# Patient Record
Sex: Female | Born: 1980 | Hispanic: Yes | Marital: Married | Smoking: Never smoker
Health system: Southern US, Community
[De-identification: ages and names within clinical notes are randomized; demographics above are authoritative.]

## PROBLEM LIST (undated history)

## (undated) DIAGNOSIS — R519 Headache, unspecified: Secondary | ICD-10-CM

## (undated) DIAGNOSIS — R51 Headache: Secondary | ICD-10-CM

## (undated) DIAGNOSIS — O139 Gestational [pregnancy-induced] hypertension without significant proteinuria, unspecified trimester: Secondary | ICD-10-CM

## (undated) HISTORY — PX: NO PAST SURGERIES: SHX2092

## (undated) HISTORY — PX: MOUTH SURGERY: SHX715

## (undated) HISTORY — DX: Headache, unspecified: R51.9

## (undated) HISTORY — DX: Headache: R51

---

## 2004-09-27 ENCOUNTER — Ambulatory Visit (HOSPITAL_COMMUNITY): Admission: RE | Admit: 2004-09-27 | Discharge: 2004-09-27 | Payer: Self-pay | Admitting: *Deleted

## 2004-12-14 ENCOUNTER — Ambulatory Visit: Payer: Self-pay | Admitting: *Deleted

## 2004-12-14 ENCOUNTER — Inpatient Hospital Stay (HOSPITAL_COMMUNITY): Admission: AD | Admit: 2004-12-14 | Discharge: 2004-12-16 | Payer: Self-pay | Admitting: Family Medicine

## 2004-12-20 ENCOUNTER — Ambulatory Visit: Payer: Self-pay | Admitting: *Deleted

## 2004-12-27 ENCOUNTER — Ambulatory Visit: Payer: Self-pay | Admitting: Family Medicine

## 2005-01-10 ENCOUNTER — Ambulatory Visit (HOSPITAL_COMMUNITY): Admission: RE | Admit: 2005-01-10 | Discharge: 2005-01-10 | Payer: Self-pay | Admitting: Family Medicine

## 2005-01-10 ENCOUNTER — Ambulatory Visit: Payer: Self-pay | Admitting: Family Medicine

## 2005-01-17 ENCOUNTER — Ambulatory Visit: Payer: Self-pay | Admitting: Family Medicine

## 2005-01-24 ENCOUNTER — Ambulatory Visit: Payer: Self-pay | Admitting: *Deleted

## 2005-01-28 ENCOUNTER — Ambulatory Visit: Payer: Self-pay | Admitting: Obstetrics & Gynecology

## 2005-01-31 ENCOUNTER — Ambulatory Visit: Payer: Self-pay | Admitting: Family Medicine

## 2005-02-04 ENCOUNTER — Ambulatory Visit: Payer: Self-pay | Admitting: *Deleted

## 2005-02-07 ENCOUNTER — Inpatient Hospital Stay (HOSPITAL_COMMUNITY): Admission: AD | Admit: 2005-02-07 | Discharge: 2005-02-10 | Payer: Self-pay | Admitting: Family Medicine

## 2005-02-07 ENCOUNTER — Ambulatory Visit: Payer: Self-pay | Admitting: Family Medicine

## 2012-03-27 ENCOUNTER — Ambulatory Visit: Payer: Self-pay

## 2012-04-01 NOTE — L&D Delivery Note (Signed)
Delivery Note At 11:15 AM a viable female was delivered via Vaginal, Spontaneous Delivery (Presentation: Left Occiput Posterior).  APGAR: 9, 9; weight .   Placenta status: Intact, Spontaneous.  Cord: 3 vessels with the following complications: None.  Cord pH: not done  Anesthesia: Epidural  Episiotomy:  Lacerations:  Suture Repair: 2.0 vicryl Est. Blood Loss (mL):   Mom to postpartum.  Baby to nursery-stable.  MARSHALL,BERNARD A 11/12/2012, 11:24 AM

## 2012-05-07 LAB — OB RESULTS CONSOLE ABO/RH: RH Type: POSITIVE

## 2012-05-07 LAB — OB RESULTS CONSOLE GC/CHLAMYDIA
Chlamydia: NEGATIVE
Gonorrhea: NEGATIVE

## 2012-05-07 LAB — OB RESULTS CONSOLE HEPATITIS B SURFACE ANTIGEN: Hepatitis B Surface Ag: NEGATIVE

## 2012-05-07 LAB — OB RESULTS CONSOLE RPR: RPR: NONREACTIVE

## 2012-05-07 LAB — OB RESULTS CONSOLE HIV ANTIBODY (ROUTINE TESTING): HIV: NONREACTIVE

## 2012-05-07 LAB — OB RESULTS CONSOLE RUBELLA ANTIBODY, IGM: Rubella: IMMUNE

## 2012-10-03 LAB — OB RESULTS CONSOLE GBS: GBS: NEGATIVE

## 2012-11-11 ENCOUNTER — Inpatient Hospital Stay (HOSPITAL_COMMUNITY)
Admission: AD | Admit: 2012-11-11 | Discharge: 2012-11-14 | DRG: 775 | Disposition: A | Discharge status: Home / Self Care | Payer: Medicaid Other | Source: Ambulatory Visit | Attending: Obstetrics | Admitting: Obstetrics

## 2012-11-11 ENCOUNTER — Encounter (HOSPITAL_COMMUNITY): Payer: Self-pay | Admitting: Anesthesiology

## 2012-11-11 ENCOUNTER — Inpatient Hospital Stay (HOSPITAL_COMMUNITY): Payer: Medicaid Other | Admitting: Anesthesiology

## 2012-11-11 ENCOUNTER — Encounter (HOSPITAL_COMMUNITY): Payer: Self-pay | Admitting: *Deleted

## 2012-11-11 HISTORY — DX: Gestational (pregnancy-induced) hypertension without significant proteinuria, unspecified trimester: O13.9

## 2012-11-11 LAB — CBC
MCH: 30.9 pg (ref 26.0–34.0)
MCHC: 34.5 g/dL (ref 30.0–36.0)
MCV: 89.5 fL (ref 78.0–100.0)
Platelets: 236 10*3/uL (ref 150–400)
RDW: 12.9 % (ref 11.5–15.5)

## 2012-11-11 MED ORDER — OXYTOCIN BOLUS FROM INFUSION: 500.0000 mL | INTRAVENOUS | Status: DC

## 2012-11-11 MED ORDER — PHENYLEPHRINE 40 MCG/ML (10ML) SYRINGE FOR IV PUSH (FOR BLOOD PRESSURE SUPPORT): 80.0000 ug | PREFILLED_SYRINGE | INTRAVENOUS | Status: DC | PRN

## 2012-11-11 MED ORDER — LACTATED RINGERS IV SOLN: 500.0000 mL | INTRAVENOUS | Status: DC | PRN

## 2012-11-11 MED ORDER — IBUPROFEN 600 MG PO TABS
600.0000 mg | ORAL_TABLET | Freq: Four times a day (QID) | ORAL | Status: DC | PRN
  Administered 2012-11-12: 600 mg via ORAL
  Filled 2012-11-11 (×2): qty 1

## 2012-11-11 MED ORDER — LACTATED RINGERS IV SOLN
500.0000 mL | Freq: Once | INTRAVENOUS | Status: AC
  Administered 2012-11-11: 500 mL via INTRAVENOUS

## 2012-11-11 MED ORDER — PHENYLEPHRINE 40 MCG/ML (10ML) SYRINGE FOR IV PUSH (FOR BLOOD PRESSURE SUPPORT)
80.0000 ug | PREFILLED_SYRINGE | INTRAVENOUS | Status: DC | PRN
  Filled 2012-11-11: qty 5

## 2012-11-11 MED ORDER — EPHEDRINE 5 MG/ML INJ
10.0000 mg | INTRAVENOUS | Status: DC | PRN
  Filled 2012-11-11: qty 4

## 2012-11-11 MED ORDER — OXYTOCIN 40 UNITS IN LACTATED RINGERS INFUSION - SIMPLE MED
62.5000 mL/h | INTRAVENOUS | Status: DC
  Filled 2012-11-11: qty 1000

## 2012-11-11 MED ORDER — ACETAMINOPHEN 325 MG PO TABS: 650.0000 mg | ORAL_TABLET | ORAL | Status: DC | PRN

## 2012-11-11 MED ORDER — ONDANSETRON HCL 4 MG/2ML IJ SOLN: 4.0000 mg | Freq: Four times a day (QID) | INTRAMUSCULAR | Status: DC | PRN

## 2012-11-11 MED ORDER — LIDOCAINE HCL (PF) 1 % IJ SOLN
30.0000 mL | INTRAMUSCULAR | Status: DC | PRN
  Filled 2012-11-11: qty 30

## 2012-11-11 MED ORDER — LIDOCAINE HCL (PF) 1 % IJ SOLN
INTRAMUSCULAR | Status: DC | PRN
  Administered 2012-11-11: 3 mL
  Administered 2012-11-11: 4 mL

## 2012-11-11 MED ORDER — CITRIC ACID-SODIUM CITRATE 334-500 MG/5ML PO SOLN: 30.0000 mL | ORAL | Status: DC | PRN

## 2012-11-11 MED ORDER — DIPHENHYDRAMINE HCL 50 MG/ML IJ SOLN
12.5000 mg | INTRAMUSCULAR | Status: DC | PRN
  Administered 2012-11-12: 12.5 mg via INTRAVENOUS
  Filled 2012-11-11: qty 1

## 2012-11-11 MED ORDER — FLEET ENEMA 7-19 GM/118ML RE ENEM: 1.0000 | ENEMA | Freq: Once | RECTAL | Status: DC

## 2012-11-11 MED ORDER — LACTATED RINGERS IV SOLN
INTRAVENOUS | Status: DC
  Administered 2012-11-11 – 2012-11-12 (×2): via INTRAVENOUS

## 2012-11-11 MED ORDER — FENTANYL 2.5 MCG/ML BUPIVACAINE 1/10 % EPIDURAL INFUSION (WH - ANES)
INTRAMUSCULAR | Status: DC | PRN
  Administered 2012-11-11: 12 mL/h via EPIDURAL

## 2012-11-11 MED ORDER — FENTANYL 2.5 MCG/ML BUPIVACAINE 1/10 % EPIDURAL INFUSION (WH - ANES)
14.0000 mL/h | INTRAMUSCULAR | Status: DC | PRN
  Administered 2012-11-12: 14 mL/h via EPIDURAL
  Filled 2012-11-11 (×2): qty 125

## 2012-11-11 MED ORDER — BUTORPHANOL TARTRATE 1 MG/ML IJ SOLN: 1.0000 mg | INTRAMUSCULAR | Status: DC | PRN

## 2012-11-11 MED ORDER — OXYCODONE-ACETAMINOPHEN 5-325 MG PO TABS
1.0000 | ORAL_TABLET | ORAL | Status: DC | PRN
  Administered 2012-11-12: 1 via ORAL
  Filled 2012-11-11: qty 1

## 2012-11-11 MED ORDER — EPHEDRINE 5 MG/ML INJ: 10.0000 mg | INTRAVENOUS | Status: DC | PRN

## 2012-11-11 NOTE — Anesthesia Procedure Notes (Signed)
Epidural Patient location during procedure: OB Start time: 11/11/2012 10:27 PM  Staffing Anesthesiologist: Pharell Rolfson A. Performed by: anesthesiologist   Preanesthetic Checklist Completed: patient identified, site marked, surgical consent, pre-op evaluation, timeout performed, IV checked, risks and benefits discussed and monitors and equipment checked  Epidural Patient position: sitting Prep: site prepped and draped and DuraPrep Patient monitoring: continuous pulse ox and blood pressure Approach: midline Injection technique: LOR air  Needle:  Needle type: Tuohy  Needle gauge: 17 G Needle length: 9 cm and 9 Needle insertion depth: 7 cm Catheter type: closed end flexible Catheter size: 19 Gauge Catheter at skin depth: 12 cm Test dose: negative and Other  Assessment Events: blood not aspirated, injection not painful, no injection resistance, negative IV test and no paresthesia  Additional Notes Patient identified. Risks and benefits discussed including failed block, incomplete  Pain control, post dural puncture headache, nerve damage, paralysis, blood pressure Changes, nausea, vomiting, reactions to medications-both toxic and allergic and post Partum back pain. All questions were answered. Patient expressed understanding and wished to proceed. Sterile technique was used throughout procedure. Epidural site was Dressed with sterile barrier dressing. No paresthesias, signs of intravascular injection Or signs of intrathecal spread were encountered.  Patient was more comfortable after the epidural was dosed. Please see RN's note for documentation of vital signs and FHR which are stable.

## 2012-11-11 NOTE — Anesthesia Preprocedure Evaluation (Signed)
Anesthesia Evaluation  Patient identified by MRN, date of birth, ID band Patient awake    Reviewed: Allergy & Precautions, H&P , Patient's Chart, lab work & pertinent test results  Airway Mallampati: II TM Distance: >3 FB Neck ROM: full    Dental no notable dental hx. (+) Teeth Intact   Pulmonary neg pulmonary ROS,  breath sounds clear to auscultation  Pulmonary exam normal       Cardiovascular hypertension, Rhythm:regular Rate:Normal  PIH   Neuro/Psych negative neurological ROS  negative psych ROS   GI/Hepatic negative GI ROS, Neg liver ROS,   Endo/Other  negative endocrine ROS  Renal/GU negative Renal ROS  negative genitourinary   Musculoskeletal   Abdominal Normal abdominal exam  (+)   Peds  Hematology negative hematology ROS (+)   Anesthesia Other Findings   Reproductive/Obstetrics (+) Pregnancy                           Anesthesia Physical Anesthesia Plan  ASA: II  Anesthesia Plan: Epidural   Post-op Pain Management:    Induction:   Airway Management Planned:   Additional Equipment:   Intra-op Plan:   Post-operative Plan:   Informed Consent: I have reviewed the patients History and Physical, chart, labs and discussed the procedure including the risks, benefits and alternatives for the proposed anesthesia with the patient or authorized representative who has indicated his/her understanding and acceptance.     Plan Discussed with: Anesthesiologist  Anesthesia Plan Comments:         Anesthesia Quick Evaluation

## 2012-11-11 NOTE — MAU Note (Signed)
Pt states she started having lower abd pain today.  Pt states she is having some vaginal bleeding like first day of period.  No other vaginal discharge or ROM.  Good fetal movement.

## 2012-11-12 ENCOUNTER — Encounter (HOSPITAL_COMMUNITY): Payer: Self-pay | Admitting: *Deleted

## 2012-11-12 LAB — ABO/RH: ABO/RH(D): O POS

## 2012-11-12 LAB — TYPE AND SCREEN

## 2012-11-12 MED ORDER — ONDANSETRON HCL 4 MG PO TABS: 4.0000 mg | ORAL_TABLET | ORAL | Status: DC | PRN

## 2012-11-12 MED ORDER — DIPHENHYDRAMINE HCL 25 MG PO CAPS: 25.0000 mg | ORAL_CAPSULE | Freq: Four times a day (QID) | ORAL | Status: DC | PRN

## 2012-11-12 MED ORDER — TERBUTALINE SULFATE 1 MG/ML IJ SOLN: 0.2500 mg | Freq: Once | INTRAMUSCULAR | Status: DC | PRN

## 2012-11-12 MED ORDER — TETANUS-DIPHTH-ACELL PERTUSSIS 5-2.5-18.5 LF-MCG/0.5 IM SUSP
0.5000 mL | Freq: Once | INTRAMUSCULAR | Status: AC
  Administered 2012-11-14: 0.5 mL via INTRAMUSCULAR
  Filled 2012-11-12 (×2): qty 0.5

## 2012-11-12 MED ORDER — DIBUCAINE 1 % RE OINT
1.0000 "application " | TOPICAL_OINTMENT | RECTAL | Status: DC | PRN
  Filled 2012-11-12: qty 28

## 2012-11-12 MED ORDER — OXYTOCIN 40 UNITS IN LACTATED RINGERS INFUSION - SIMPLE MED
1.0000 m[IU]/min | INTRAVENOUS | Status: DC
  Administered 2012-11-12: 2 m[IU]/min via INTRAVENOUS

## 2012-11-12 MED ORDER — ZOLPIDEM TARTRATE 5 MG PO TABS: 5.0000 mg | ORAL_TABLET | Freq: Every evening | ORAL | Status: DC | PRN

## 2012-11-12 MED ORDER — LACTATED RINGERS IV SOLN: INTRAVENOUS | Status: DC

## 2012-11-12 MED ORDER — BENZOCAINE-MENTHOL 20-0.5 % EX AERO
1.0000 "application " | INHALATION_SPRAY | CUTANEOUS | Status: DC | PRN
  Filled 2012-11-12: qty 56

## 2012-11-12 MED ORDER — BUPIVACAINE HCL (PF) 0.25 % IJ SOLN
INTRAMUSCULAR | Status: DC | PRN
  Administered 2012-11-12: 5 mL
  Administered 2012-11-12: 2 mL
  Administered 2012-11-12: 3 mL

## 2012-11-12 MED ORDER — LANOLIN HYDROUS EX OINT: TOPICAL_OINTMENT | CUTANEOUS | Status: DC | PRN

## 2012-11-12 MED ORDER — WITCH HAZEL-GLYCERIN EX PADS: 1.0000 "application " | MEDICATED_PAD | CUTANEOUS | Status: DC | PRN

## 2012-11-12 MED ORDER — SENNOSIDES-DOCUSATE SODIUM 8.6-50 MG PO TABS
2.0000 | ORAL_TABLET | Freq: Every day | ORAL | Status: DC
  Administered 2012-11-12 – 2012-11-13 (×2): 2 via ORAL

## 2012-11-12 MED ORDER — SIMETHICONE 80 MG PO CHEW: 80.0000 mg | CHEWABLE_TABLET | ORAL | Status: DC | PRN

## 2012-11-12 MED ORDER — ONDANSETRON HCL 4 MG/2ML IJ SOLN: 4.0000 mg | INTRAMUSCULAR | Status: DC | PRN

## 2012-11-12 MED ORDER — FERROUS SULFATE 325 (65 FE) MG PO TABS
325.0000 mg | ORAL_TABLET | Freq: Two times a day (BID) | ORAL | Status: DC
  Administered 2012-11-12 – 2012-11-14 (×4): 325 mg via ORAL
  Filled 2012-11-12 (×6): qty 1

## 2012-11-12 MED ORDER — FAMOTIDINE IN NACL 20-0.9 MG/50ML-% IV SOLN
20.0000 mg | Freq: Once | INTRAVENOUS | Status: AC
Start: 2012-11-12 — End: 2012-11-12
  Administered 2012-11-12: 20 mg via INTRAVENOUS
  Filled 2012-11-12: qty 50

## 2012-11-12 MED ORDER — PRENATAL MULTIVITAMIN CH
1.0000 | ORAL_TABLET | Freq: Every day | ORAL | Status: DC
  Administered 2012-11-12 – 2012-11-14 (×3): 1 via ORAL
  Filled 2012-11-12 (×3): qty 1

## 2012-11-12 MED ORDER — IBUPROFEN 600 MG PO TABS
600.0000 mg | ORAL_TABLET | Freq: Four times a day (QID) | ORAL | Status: DC
  Administered 2012-11-12 – 2012-11-14 (×8): 600 mg via ORAL
  Filled 2012-11-12 (×7): qty 1

## 2012-11-12 MED ORDER — BUTALBITAL-APAP-CAFFEINE 50-325-40 MG PO TABS
1.0000 | ORAL_TABLET | ORAL | Status: DC | PRN
  Administered 2012-11-12: 1 via ORAL
  Filled 2012-11-12: qty 1

## 2012-11-12 MED ORDER — OXYCODONE-ACETAMINOPHEN 5-325 MG PO TABS
1.0000 | ORAL_TABLET | ORAL | Status: DC | PRN
  Administered 2012-11-12 – 2012-11-13 (×2): 1 via ORAL
  Administered 2012-11-13 (×2): 2 via ORAL
  Administered 2012-11-14: 1 via ORAL
  Filled 2012-11-12 (×3): qty 1
  Filled 2012-11-12 (×2): qty 2

## 2012-11-12 NOTE — Progress Notes (Signed)
Pt stated she was having chest pain, headache and fhr tracing variables. Dr. Gaynell Face and interpreter called for clarification. Pt said the headache and chest pains ended after a few minutes. Pulse ox placed and pt is stable at this time.

## 2012-11-12 NOTE — H&P (Signed)
This is Dr. Francoise Ceo dictating the history and physical on blank blank she's a 32 year old gravida 2 para 1001 at 90 weeks and 6 days due date 11/20/2012 negative GBS admitted in labor she is now 6 cm 80-90% vertex -2-3 amniotomy performed fluid clear IUPC inserted and she is on Pitocin Past medical history negative Past surgical history negative Social history negative System review negative Physical exam well-developed female in labor HEENT negative Lungs clear to P&A Breasts negative Heart regular rhythm no murmurs no gallops Abdomen term Pelvic as described above Extremities negative and

## 2012-11-12 NOTE — Progress Notes (Signed)
Dr. Gaynell Face notified of SVE, FHR, UC pattern, MVUs, tachsystole noted, and medication given.  Dr. Gaynell Face also notified that pt denies chest pain at this time.  Orders received to D/C pitocin, will continue to monitor.

## 2012-11-13 LAB — CBC
HCT: 26.6 % — ABNORMAL LOW (ref 36.0–46.0)
MCH: 30.9 pg (ref 26.0–34.0)
MCHC: 35 g/dL (ref 30.0–36.0)
MCV: 88.4 fL (ref 78.0–100.0)
Platelets: 106 10*3/uL — ABNORMAL LOW (ref 150–400)
RDW: 13.2 % (ref 11.5–15.5)
WBC: 12.5 10*3/uL — ABNORMAL HIGH (ref 4.0–10.5)

## 2012-11-13 NOTE — Progress Notes (Signed)
UR chart review completed.  

## 2012-11-13 NOTE — Progress Notes (Signed)
Patient ID: Caroline Garrison, female   DOB: Apr 14, 1980, 32 y.o.   MRN: [id number] Postpartum day one Vital signs normal Fundus firm Lochia moderate Legs negative Doing well

## 2012-11-13 NOTE — Anesthesia Postprocedure Evaluation (Signed)
  Anesthesia Post-op Note  Anesthesia Post Note  Patient: Caroline Garrison  Procedure(s) Performed: * No procedures listed *  Anesthesia type: Spinal  Patient location: Mother/Baby  Post pain: Pain level controlled  Post assessment: Post-op Vital signs reviewed  Last Vitals:  Filed Vitals:   11/13/12 0608  BP: 101/64  Pulse: 73  Temp: 36.5 C  Resp: 18    Post vital signs: Reviewed  Level of consciousness: awake  Complications: No apparent anesthesia complications

## 2012-11-14 NOTE — Discharge Summary (Signed)
Obstetric Discharge Summary Reason for Admission: onset of labor Prenatal Procedures: none Intrapartum Procedures: spontaneous vaginal delivery Postpartum Procedures: none Complications-Operative and Postpartum: none Hemoglobin  Date Value Range Status  11/13/2012 9.3* 12.0 - 15.0 g/dL Final     DELTA CHECK NOTED     REPEATED TO VERIFY     HCT  Date Value Range Status  11/13/2012 26.6* 36.0 - 46.0 % Final    Physical Exam:  General: alert Lochia: appropriate Uterine Fundus: firm Incision: healing well DVT Evaluation: No evidence of DVT seen on physical exam.  Discharge Diagnoses: Term Pregnancy-delivered  Discharge Information: Date: 11/14/2012 Activity: pelvic rest Diet: routine Medications: Percocet Condition: stable Instructions: refer to practice specific booklet Discharge to: home Follow-up Information   Follow up with Kathreen Cosier, MD.   Specialty:  Obstetrics and Gynecology   Contact information:   [address] [place] [address] [phone]       Newborn Data: Live born female  Birth Weight: 6 lb 14 oz (3119 g) APGAR: 9, 9  Home with mother.  MARSHALL,BERNARD A 11/14/2012, 6:38 AM

## 2014-01-31 ENCOUNTER — Encounter (HOSPITAL_COMMUNITY): Payer: Self-pay | Admitting: *Deleted

## 2016-03-21 DIAGNOSIS — Z8759 Personal history of other complications of pregnancy, childbirth and the puerperium: Secondary | ICD-10-CM | POA: Insufficient documentation

## 2016-04-05 ENCOUNTER — Ambulatory Visit (INDEPENDENT_AMBULATORY_CARE_PROVIDER_SITE_OTHER): Payer: Self-pay | Admitting: Neurology

## 2016-04-05 ENCOUNTER — Encounter: Payer: Self-pay | Admitting: Neurology

## 2016-04-05 VITALS — BP 134/83 | HR 80 | Wt 128.0 lb

## 2016-04-05 DIAGNOSIS — G44221 Chronic tension-type headache, intractable: Secondary | ICD-10-CM

## 2016-04-05 DIAGNOSIS — R51 Headache: Secondary | ICD-10-CM

## 2016-04-05 DIAGNOSIS — R519 Headache, unspecified: Secondary | ICD-10-CM

## 2016-04-05 DIAGNOSIS — G44229 Chronic tension-type headache, not intractable: Secondary | ICD-10-CM | POA: Insufficient documentation

## 2016-04-05 MED ORDER — AMITRIPTYLINE HCL 25 MG PO TABS: 25.0000 mg | ORAL_TABLET | Freq: Every day | ORAL | 6 refills | Status: DC

## 2016-04-05 NOTE — Progress Notes (Signed)
[facility]    Provider:  Dr Lucia GaskinsAhern Referring Provider: Sandre KittyLonergan, Malia A, PA-C Primary Care Physician:  Sandre KittyLonergan, Malia A, PA-C  CC:  headaches  HPI:  Caroline Garrison is a 36 y.o. female here as a referral from Dr. Missy SabinsLonergan for tension headaches. Past medical history of hypertension, insomnia, tension headaches and migraines for 3 years. She is non-English speaking and is here with an interpreter. Headaches are in the temple areas, pain doesn't go away, it "comes and goes", since December getting worse, doctor prescribed Propranolol and Trazodone but patient not taking these they did not help. She also feels tension in the neck and in the temples. She has insomnia, she can;t sleep and burning on the top of the head. Headache described as pounding. Also pain in the eyes. She has headaches every day. They can be 2-3/10 but at it worse it can be 8-9/10. It can last all day long, continuous sje goes to bed with it and wakes up with it. Sometimes it comes and goes throughout the day. No nausea or vomiting. When the headache is very strong the light and noise bothers her. She has burning in the head. She has not had images of the brain. Sister has migraines. She denies stress. She has a lot of tension in the neck. No other focal neurologic deficits, associated symptoms, modifiable factors or inciting events.  Reviewed notes, labs and imaging from outside physicians, which showed:   Review primary care notes. She complains of headache, and the temporal areas and at the top of the head, pressure pulsating running, burning and heavy pain. She suffered from both migraine headaches and tension headaches for years. She often times feels dizzy. No nausea vomiting, no speech difficulties, no syncope, no paresthesias, no nasal congestion or rhinorrhea, no knee felt pressure, no sore throat, no cough no shortness of breath. Headaches are often. Recent stress in her life making it worse. She  notices tension in the neck and upper shoulders as well. No inciting events or trauma. Exam showed spasms and tenderness on palpation cervical muscles. Otherwise musculoskeletal system in general as well as neurologic exam is normal. They do describe anxiety and her mental status report. She was started on diclofenac for tension type headaches twice a day, and Flexeril daily for cervical muscular pain. Neurologically exams normal, headache is likely tension headache per consult the patient thinks she may have migraine without aura. Dizziness also related to headaches. For her insomnia she was started on trazodone, for her tension type headaches she was given diclofenac, for her migraine without aura result triptan caused side effects and propranolol was started for preventative as well as sumatriptan.   CBC with differential 05/22/2015 was normal.   Review of Systems: Patient complains of symptoms per HPI as well as the following symptoms: Blurred vision, easy bruising, headache, insomnia, sleepiness. Pertinent negatives per HPI. All others negative.   Social History   Social History  . Marital status: Single    Spouse name: N/A  . Number of children: N/A  . Years of education: N/A   Occupational History  . not working    Social History Main Topics  . Smoking status: Never Smoker  . Smokeless tobacco: Not on file  . Alcohol use No  . Drug use: No  . Sexual activity: Yes    Birth control/ protection: None   Other Topics Concern  . Not on file   Social History Narrative   Lives   Caffeine use:  Family History  Problem Relation Age of Onset  . Migraines Sister   . Cancer Neg Hx   . Heart disease Neg Hx   . Hypertension Neg Hx     Past Medical History:  Diagnosis Date  . Pregnancy induced hypertension     Past Surgical History:  Procedure Laterality Date  . MOUTH SURGERY    . NO PAST SURGERIES      Current Outpatient Prescriptions  Medication Sig Dispense Refill    . cyclobenzaprine (FLEXERIL) 10 MG tablet Take 10 mg by mouth at bedtime.    Marland Kitchen amitriptyline (ELAVIL) 25 MG tablet Take 1 tablet (25 mg total) by mouth at bedtime. 30 tablet 6   No current facility-administered medications for this visit.     Allergies as of 04/05/2016  . (No Known Allergies)    Vitals: BP 134/83   Pulse 80   Wt 128 lb (58.1 kg)   BMI 25.00 kg/m  Last Weight:  Wt Readings from Last 1 Encounters:  04/05/16 128 lb (58.1 kg)   Last Height:   Ht Readings from Last 1 Encounters:  11/11/12 5' (1.524 m)   Physical exam: Exam: Gen: NAD, conversant, well nourised, well groomed                     CV: RRR, no MRG. No Carotid Bruits. No peripheral edema, warm, nontender Eyes: Conjunctivae clear without exudates or hemorrhage  Neuro: Detailed Neurologic Exam  Speech:    Speech is normal; fluent and spontaneous with normal comprehension.  Cognition:    The patient is oriented to person, place, and time;     recent and remote memory intact;     language fluent;     normal attention, concentration,     fund of knowledge Cranial Nerves:    The pupils are equal, round, and reactive to light. The fundi are normal and spontaneous venous pulsations are present. Visual fields are full to finger confrontation. Extraocular movements are intact. Trigeminal sensation is intact and the muscles of mastication are normal. The face is symmetric. The palate elevates in the midline. Hearing intact. Voice is normal. Shoulder shrug is normal. The tongue has normal motion without fasciculations.   Coordination:    Normal finger to nose and heel to shin. Normal rapid alternating movements.   Gait:    Heel-toe and tandem gait are normal.   Motor Observation:    No asymmetry, no atrophy, and no involuntary movements noted. Tone:    Normal muscle tone.    Posture:    Posture is normal. normal erect    Strength:    Strength is V/V in the upper and lower limbs.      Sensation:  intact to LT     Reflex Exam:  DTR's:    Deep tendon reflexes in the upper and lower extremities are normal bilaterally.   Toes:    The toes are downgoing bilaterally.   Clonus:    Clonus is absent.       Assessment/Plan:  Patient has worsening tension-type headaches with some migrainous features. Discussed headaches, triggers, lifestyle modification, stress reduction, good sleeping habits.  - CT of the head - will start Amitriptyline which may help with headaches and insomnia as well as cervical muscle pain - Discussed side effects including teratogenicity, Use birth control, do not get pregnant - Will check a bmp - Discussed the following: To prevent or relieve headaches, try the following: Cool Compress. Lie down and  place a cool compress on your head.  Avoid headache triggers. If certain foods or odors seem to have triggered your migraines in the past, avoid them. A headache diary might help you identify triggers.  Include physical activity in your daily routine. Try a daily walk or other moderate aerobic exercise.  Manage stress. Find healthy ways to cope with the stressors, such as delegating tasks on your to-do list.  Practice relaxation techniques. Try deep breathing, yoga, massage and visualization.  Eat regularly. Eating regularly scheduled meals and maintaining a healthy diet might help prevent headaches. Also, drink plenty of fluids.  Follow a regular sleep schedule. Sleep deprivation might contribute to headaches Consider biofeedback. With this mind-body technique, you learn to control certain bodily functions - such as muscle tension, heart rate and blood pressure - to prevent headaches or reduce headache pain.    Proceed to emergency room if you experience new or worsening symptoms or symptoms do not resolve, if you have new neurologic symptoms or if headache is severe, or for any concerning symptom.   Cc: Sandre Kitty, PA-C  Naomie Dean, MD  [facility] [address] [place], [place] [address]  Phone [phone] Fax [phone]

## 2016-04-05 NOTE — Patient Instructions (Addendum)
Remember to drink plenty of fluid, eat healthy meals and do not skip any meals. Try to eat protein with a every meal and eat a healthy snack such as fruit or nuts in between meals. Try to keep a regular sleep-wake schedule and try to exercise daily, particularly in the form of walking, 20-30 minutes a day, if you can.   As far as your medications are concerned, I would like to suggest: Amitriptyline at bedtime, lab. Diclofenac only as needed.   As far as diagnostic testing: imaging of the brain   Our phone number is [phone]. We also have an after hours call service for urgent matters and there is a physician on-call for urgent questions. For any emergencies you know to call 911 or go to the nearest emergency room Amitriptyline tablets Qu es este medicamento? La AMITRIPTILINA se utiliza para tratar la migraine and headache. Este medicamento puede ser utilizado para otros usos; si tiene alguna pregunta consulte con su proveedor de atencin mdica o con su farmacutico. MARCAS COMUNES: Elavil, Vanatrip Qu le debo informar a mi profesional de la salud antes de tomar este medicamento? Necesita saber si usted presenta alguno de los siguientes problemas o situaciones: -problemas de alcoholismo -asma, dificultad al respirar -trastorno bipolar o esquizofrenia -dificultad para orinar, problema de prstata -glaucoma -enfermedad cardiaca o ataque cardiaco previo -enfermedad heptica -hipertiroidismo -convulsiones -ideas o planes suicidos, intentos de suicidio previos o antecendentes familiares de intentos de suicidio -una reaccin alrgica o inusual a la amitriptilina, otros medicamentos, alimentos, colorantes o conservadores -si est embarazada o buscando quedar embarazada -si est amamantando a un beb Cmo debo utilizar este medicamento? Tome este medicamento por va oral con un vaso de agua. Siga las instrucciones de la etiqueta del [place]medic[place]. Las tabletas se pueden tomar con o sin  alimentos. Tome sus dosis a intervalos regulares. No tome su medicamento con una frecuencia mayor a la indicada. No deje de tomar PPL Corporationeste medicamento repentinamente a menos que as indique su mdico. El detener este medicamento demasiado rpido puede causar efectos secundarios graves o puede empeorar su condicin. Su farmacutico le dar una [place] del medicamento especial con cada receta y relleno. Asegrese de leer esta informacin cada vez cuidadosamente. Hable con su pediatra para informarse acerca del uso de este medicamento en nios. Puede requerir atencin especial. Sobredosis: Pngase en contacto inmediatamente con un centro toxicolgico o una sala de urgencia si usted cree que haya tomado demasiado medicamento. ATENCIN: Reynolds AmericanEste medicamento es solo para usted. No comparta este medicamento con nadie. Qu sucede si me olvido de una dosis? Si olvida una dosis, tmela lo antes posible. Si es casi la hora de la prxima dosis, tome slo esa dosis. No tome dosis adicionales o dobles. Qu puede interactuar con este medicamento? No tome esta medicina con ninguno de los siguientes medicamentos: -trixido de arsnico -ciertos medicamentos utilizados para regular los latidos cardiacos anormales o tratar otros problemas cardiacos -cisapride -droperidol -halofantrina -linezolid -IMAOs, tales como Carbex, Eldepryl, Marplan, Nardil y Parnate -azul de metileno -otros medicamentos para la depresin mental -fenotiazinas, tales como Dealerclorpromacina, tioridazina y perfenacina -pimozida -probucol -procarbazina -esparfloxacino -hierba de [place] -ziprasidona Esta medicina tambin puede interactuar con los siguientes medicamentos: -atropina y Media plannermedicamentos relacionados, como la hiosciamina, escopolamina, tolterodina y Holiday representativeotros -barbitricos para inducir el sueo o para el tratamiento de convulsiones, tales como el fenobarbital -cimetidina -disulfiram -etclorvinol -hormonas tiroideas Immunologistcomo la levotiroxina Puede  ser que esta lista no menciona todas las posibles interacciones. Informe a su profesional de Beazer Homesla salud  de [employer] a base de hierbas, medicamentos de venta libre o suplementos nutritivos que est tomando. Si usted fuma, consume bebidas alcohlicas o si utiliza drogas ilegales, indqueselo tambin a su profesional de [employer]. Algunas sustancias pueden interactuar con su medicamento. A qu debo estar atento al usar [employer]? Informe a su mdico si sus sntomas no mejoran o si empeoran. Visite a su mdico o a su profesional de la salud para chequear su evolucin peridicamente. Debido que puede ser necesario tomar este medicamento durante varias semanas para que sea posible observar sus efectos en forma [place], es importante que sigue su tratamiento como recetado por su mdico. Los pacientes y sus familias deben estar atentos si empeora la depresin o ideas suicidas. Tambin est atento a cambios repentinos o severos de emocin, tales como el sentirse ansioso, agitado, lleno de pnico, irritable, hostil, agresivo, impulsivo, inquietud severa, demasiado excitado y hiperactivo o dificultad para conciliar el sueo. Si esto ocurre, especialmente al comenzar con un tratamiento antidepresivo o al cambiar de dosis, comunquese con su profesional de [employer]. Puede experimentar mareos o somnolencia. No conduzca ni utilice maquinaria ni haga nada que Scientist, research (life sciences) en estado de alerta hasta que sepa cmo le afecta este medicamento. No se siente ni se ponga de pie con rapidez, especialmente si es un paciente de edad avanzada. Esto reduce el riesgo de mareos o [employer]. El alcohol puede interferir con el efecto de [place]. Evite consumir bebidas alcohlicas. No se trate usted mismo si tiene tos, resfro o Environmental consultant sin Science writer con su mdico o con su profesional de [employer]. Algunos ingredientes pueden aumentar los posibles efectos secundarios. Se le podr secar la boca. Masticar chicle  sin azcar, chupar caramelos duros y tomar agua en abundancia le ayudar a mantener la boca hmeda. Si el problema no desaparece o es severo, consulte a su mdico. Este medicamento puede resecarle los ojos y provocar visin borrosa. Si [place] lentes de contacto, puede sentir ciertas molestias. Las gotas lubricantes pueden ser tiles. Si el problema no desaparece o es severo, consulte con su mdico de los ojos. Este medicamento causar estreimiento. Trate de evacuar los intestinos al menos cada 2  3 das. Si no evacua los intestinos durante [address]  [address], comunquese con su mdico o con su profesional de [employer]. Este medicamento puede aumentar la sensibilidad al sol. Mantngase fuera de Secretary/administrator. Si no lo puede evitar, utilice ropa protectora y crema de Orthoptist. No utilice lmparas solares, camas solares ni cabinas solares. Qu efectos secundarios puedo tener al [employer] este medicamento? Efectos secundarios que debe informar a su mdico o a Producer, television/film/video de la salud tan pronto como sea posible: Therapist, art, como erupcin cutnea, comezn/picazn o urticarias, e hinchazn de la cara, los labios o la lengua ansiedad problemas respiratorios cambios en la visin confusin estado de nimo elevado, menor necesidad de dormir, pensamientos acelerados, conducta impulsiva dolor ocular ritmo cardiaco rpido, irregular sensacin de desmayos o aturdimiento, cadas sensacin de agitacin, enojo o irritabilidad fiebre con aumento de la sudoracin alucinaciones, prdida del contacto con la realidad convulsiones rigidez de los msculos ideas suicidas u otros cambios en el estado de nimo hormigueo, Engineer, mining o entumecimiento de los pies o las manos dificultad para Geographical information systems officer o cambios en el volumen de orina dificultad para conciliar el sueo cansancio o debilidad inusual vmito color amarillento de los ojos o la piel Efectos secundarios que generalmente no requieren atencin mdica (infrmelos a su mdico o a su  profesional de la salud si persisten o si son molestos): cambios en el deseo o desempeo sexual cambios en el apetito o el peso estreimiento mareos boca seca nuseas cansancio temblores Programme researcher, broadcasting/film/video Puede ser que esta lista no menciona todos los posibles efectos secundarios. Comunquese a su mdico por asesoramiento mdico [employer]lett[employer]ard. Usted puede informar los efectos secundarios a la FDA por telfono al 1-800-FDA-1088. Dnde debo guardar mi medicina? Mantngala fuera del alcance de los nios. Gurdela a [employer]I, entre 20 y 25 grados C (35 y 33 grados F). Deseche todo el medicamento que no haya utilizado, despus de su fecha de vencimiento. ATENCIN: Este folleto es un resumen. Puede ser que no cubra toda la posible informacin. Si usted tiene preguntas acerca de esta medicina, consulte con su mdico, su farmacutico o su profesional de Radiographer, therapeutic.  2017 Elsevier/Gold Standard (2015-10-27 00:00:00)

## 2016-04-06 LAB — BASIC METABOLIC PANEL
BUN/Creatinine Ratio: 14 (ref 9–23)
BUN: 8 mg/dL (ref 6–20)
CALCIUM: 9.2 mg/dL (ref 8.7–10.2)
CHLORIDE: 103 mmol/L (ref 96–106)
CO2: 22 mmol/L (ref 18–29)
Creatinine, Ser: 0.56 mg/dL — ABNORMAL LOW (ref 0.57–1.00)
GFR calc non Af Amer: 123 mL/min/{1.73_m2} (ref >59)
GFR, EST AFRICAN AMERICAN: 142 mL/min/{1.73_m2} (ref >59)
Glucose: 113 mg/dL — ABNORMAL HIGH (ref 65–99)
Potassium: 4.6 mmol/L (ref 3.5–5.2)
Sodium: 139 mmol/L (ref 134–144)

## 2016-04-08 ENCOUNTER — Telehealth: Payer: Self-pay | Admitting: *Deleted

## 2016-04-08 NOTE — Telephone Encounter (Signed)
Called [facility]. Spoke with WG#[id number], Georgette ShellMariana. She called and LVM for pt about unremarkable labs per AA,MD note.

## 2016-04-08 NOTE — Telephone Encounter (Signed)
-----   Message from Anson FretAntonia B Ahern, MD sent at 04/06/2016  9:12 AM EST ----- Labs unremarkable

## 2016-04-09 ENCOUNTER — Ambulatory Visit
Admission: RE | Admit: 2016-04-09 | Discharge: 2016-04-09 | Disposition: A | Discharge status: Home / Self Care | Payer: No Typology Code available for payment source | Source: Ambulatory Visit | Attending: Neurology | Admitting: Neurology

## 2016-04-09 DIAGNOSIS — R519 Headache, unspecified: Secondary | ICD-10-CM

## 2016-04-09 DIAGNOSIS — R51 Headache: Secondary | ICD-10-CM

## 2016-04-11 ENCOUNTER — Telehealth: Payer: Self-pay | Admitting: *Deleted

## 2016-04-11 NOTE — Telephone Encounter (Signed)
Called [facility] [employer]. Spoke with ID# [phone], Diego (Spanish interpreter). He LVM for pt about CT head results. He gave her [facility] phone number if she has further questions.

## 2016-04-11 NOTE — Telephone Encounter (Signed)
-----   Message from Anson FretAntonia B Ahern, MD sent at 04/11/2016  8:29 AM EST ----- CT head normal

## 2016-10-03 ENCOUNTER — Ambulatory Visit: Payer: Self-pay | Admitting: Nurse Practitioner

## 2016-10-10 ENCOUNTER — Ambulatory Visit (INDEPENDENT_AMBULATORY_CARE_PROVIDER_SITE_OTHER): Payer: Self-pay | Admitting: Nurse Practitioner

## 2016-10-10 ENCOUNTER — Encounter: Payer: Self-pay | Admitting: Nurse Practitioner

## 2016-10-10 VITALS — BP 122/77 | HR 76 | Ht 60.0 in | Wt 128.4 lb

## 2016-10-10 DIAGNOSIS — G44229 Chronic tension-type headache, not intractable: Secondary | ICD-10-CM

## 2016-10-10 NOTE — Patient Instructions (Signed)
Continue Amitriptyline at current dose will refill for 1 year  Avoid headache triggers. If certain foods or odors seem to have triggered your migraines in the past, avoid them. A headache diary might help you identify triggers.   Include physical activity in your daily routine. Try a daily walk or other moderate aerobic exercise.   Manage stress. Find healthy ways to cope with the stressors, such as delegating tasks on your to-do list.   Practice relaxation techniques. Try deep breathing, yoga, massage and visualization.   Eat regularly. Eating regularly scheduled meals and maintaining a healthy diet might help prevent headaches. Also, drink plenty of fluids.   Follow a regular sleep schedule. Sleep deprivation might contribute to headaches  Follow up yearly

## 2016-10-10 NOTE — Progress Notes (Signed)
[facility]  PATIENT: Caroline Garrison DOB: 11/15/1980   REASON FOR VISIT: Follow-up for headaches HISTORY FROM: Patient and interpreter    HISTORY OF PRESENT ILLNESS:UPDATE 07/12/2018CM patient returns for follow-up with her interpreter for history of headaches. She also has history of hypertension and insomnia and migraine feature headaches. She was placed on amitriptyline at her last visit and is currently taking 25 mg every day. Her headaches are in good control she is pleased with her response. She  is not taking the Flexeril she felt it was ineffective. She returns for reevaluation  HISTORY 04/05/16 Caroline Garrison is a 36 y.o. female here as a referral from Dr. Missy SabinsLonergan for tension headaches. Past medical history of hypertension, insomnia, tension headaches and migraines for 3 years. She is non-English speaking and is here with an interpreter. Headaches are in the temple areas, pain doesn't go away, it "comes and goes", since December getting worse, doctor prescribed Propranolol and Trazodone but patient not taking these they did not help. She also feels tension in the neck and in the temples. She has insomnia, she can;t sleep and burning on the top of the head. Headache described as pounding. Also pain in the eyes. She has headaches every day. They can be 2-3/10 but at it worse it can be 8-9/10. It can last all day long, continuous sje goes to bed with it and wakes up with it. Sometimes it comes and goes throughout the day. No nausea or vomiting. When the headache is very strong the light and noise bothers her. She has burning in the head. She has not had images of the brain. Sister has migraines. She denies stress. She has a lot of tension in the neck. No other focal neurologic deficits, associated symptoms, modifiable factors or inciting events.    REVIEW OF SYSTEMS: Full 14 system review of systems performed and notable only for those listed, all others are  neg:  Constitutional: neg  Cardiovascular: neg Ear/Nose/Throat: neg  Skin: neg Eyes: neg Respiratory: neg Gastroitestinal: neg  Hematology/Lymphatic: neg  Endocrine: neg Musculoskeletal:neg Allergy/Immunology: neg Neurological: neg Psychiatric: neg Sleep : neg   ALLERGIES: No Known Allergies  HOME MEDICATIONS: Outpatient Medications Prior to Visit  Medication Sig Dispense Refill  . amitriptyline (ELAVIL) 25 MG tablet Take 1 tablet (25 mg total) by mouth at bedtime. 30 tablet 6  . cyclobenzaprine (FLEXERIL) 10 MG tablet Take 10 mg by mouth at bedtime.     No facility-administered medications prior to visit.     PAST MEDICAL HISTORY: Past Medical History:  Diagnosis Date  . Pregnancy induced hypertension     PAST SURGICAL HISTORY: Past Surgical History:  Procedure Laterality Date  . MOUTH SURGERY    . NO PAST SURGERIES      FAMILY HISTORY: Family History  Problem Relation Age of Onset  . Migraines Sister   . Cancer Neg Hx   . Heart disease Neg Hx   . Hypertension Neg Hx     SOCIAL HISTORY: Social History   Social History  . Marital status: Single    Spouse name: N/A  . Number of children: N/A  . Years of education: N/A   Occupational History  . not working    Social History Main Topics  . Smoking status: Never Smoker  . Smokeless tobacco: Never Used  . Alcohol use No  . Drug use: No  . Sexual activity: Yes    Birth control/ protection: None   Other Topics Concern  .  Not on file   Social History Narrative   Lives  Home with husband and children, 2 kids.     Caffeine use: 1 cup daily.  No sodas rare.      PHYSICAL EXAM  Vitals:   10/10/16 1312  BP: 122/77  Pulse: 76  Weight: 128 lb 6.4 oz (58.2 kg)  Height: 5' (1.524 m)   Body mass index is 25.08 kg/m.  Generalized: Well developed, in no acute distress  Head: normocephalic and atraumatic,. Oropharynx benign  Neck: Supple,    Musculoskeletal: No deformity   Neurological  examination   Mentation: Alert oriented to time, place, history taking. Attention span and concentration appropriate. Recent and remote memory intact.  Follows all commands speech and language fluent.   Cranial nerve II-XII: Pupils were equal round reactive to light extraocular movements were full, visual field were full on confrontational test. Facial sensation and strength were normal. hearing was intact to finger rubbing bilaterally. Uvula tongue midline. head turning and shoulder shrug were normal and symmetric.Tongue protrusion into cheek strength was normal. Motor: normal bulk and tone, full strength in the BUE, BLE, fine finger movements normal, no pronator drift. No focal weakness Sensory: normal and symmetric to light touch,   Coordination: finger-nose-finger, heel-to-shin bilaterally, no dysmetria Reflexes: Brachioradialis 2/2, biceps 2/2, triceps 2/2, patellar 2/2, Achilles 2/2, plantar responses were flexor bilaterally. Gait and Station: Rising up from seated position without assistance, normal stance,  moderate stride, good arm swing, smooth turning, able to perform tiptoe, and heel walking without difficulty. Tandem gait is steady  DIAGNOSTIC DATA (LABS, IMAGING, TESTING) - I reviewed patient records, labs, notes, testing and imaging myself where available.      Component Value Date/Time   NA 139 04/05/2016 1245   K 4.6 04/05/2016 1245   CL 103 04/05/2016 1245   CO2 22 04/05/2016 1245   GLUCOSE 113 (H) 04/05/2016 1245   BUN 8 04/05/2016 1245   CREATININE 0.56 (L) 04/05/2016 1245   CALCIUM 9.2 04/05/2016 1245   GFRNONAA 123 04/05/2016 1245   GFRAA 142 04/05/2016 1245    ASSESSMENT AND PLAN  36 y.o. year old female  has a past medical history of tension-type headaches with migrainous features. CT of the head 04/09/2016 was normal    PLAN: Continue Amitriptyline at current dose will refill for 1 year Avoid headache triggers. If certain foods or odors seem to have  triggered your migraines in the past, avoid them. A headache diary might help you identify triggers.  Include physical activity in your daily routine. Try a daily walk or other moderate aerobic exercise.  Manage stress. Find healthy ways to cope with the stressors, such as delegating tasks on your to-do list.  Practice relaxation techniques. Try deep breathing, yoga, massage and visualization.  Eat regularly. Eating regularly scheduled meals and maintaining a healthy diet might help prevent headaches. Also, drink plenty of fluids.  Follow a regular sleep schedule. Sleep deprivation might contribute to headaches Follow up yearly All information provided to the patient through interpreter Nilda Riggs, [facility], [facility], APRN  [facility] [address] [place], [place] [address] [phone]

## 2016-10-12 NOTE — Progress Notes (Signed)
Personally  participated in, made any corrections needed, and agree with history, physical, neuro exam,assessment and plan as stated.     Antonia Ahern, MD [facility]     

## 2016-10-30 ENCOUNTER — Other Ambulatory Visit: Payer: Self-pay | Admitting: Neurology

## 2016-10-30 DIAGNOSIS — R51 Headache: Principal | ICD-10-CM

## 2016-10-30 DIAGNOSIS — R519 Headache, unspecified: Secondary | ICD-10-CM

## 2016-10-31 ENCOUNTER — Other Ambulatory Visit: Payer: Self-pay | Admitting: Neurology

## 2017-05-19 ENCOUNTER — Telehealth: Payer: Self-pay | Admitting: Nurse Practitioner

## 2017-05-19 DIAGNOSIS — R51 Headache: Principal | ICD-10-CM

## 2017-05-19 DIAGNOSIS — R519 Headache, unspecified: Secondary | ICD-10-CM

## 2017-05-19 MED ORDER — AMITRIPTYLINE HCL 25 MG PO TABS: 25.0000 mg | ORAL_TABLET | Freq: Every day | ORAL | 1 refills | Status: DC

## 2017-05-19 NOTE — Telephone Encounter (Signed)
Refilled 90 day supply and one refill.  Has appt in 09/2017.

## 2017-05-19 NOTE — Telephone Encounter (Signed)
Pt's husband calling request refill for amitriptyline (ELAVIL) 25 MG tablet sent to [facility]

## 2017-06-02 ENCOUNTER — Telehealth: Payer: Self-pay | Admitting: Nurse Practitioner

## 2017-06-02 NOTE — Telephone Encounter (Signed)
Pts husband called stating pt has been having an extremely dry mouth as a side effect from amitriptyline (ELAVIL) 25 MG tablet and is wanting to know if she is able to switch to a different medication, please call to advise

## 2017-06-03 NOTE — Telephone Encounter (Signed)
Because she needs intrepretor I would not change meds over the phone. She can obtain a sooner apt or be seen by her MD here.

## 2017-06-03 NOTE — Telephone Encounter (Signed)
Patient is scheduled for follow up with NP on 06/06/17. Will call [facility] interpreter services tomorrow to schedule interpreter.

## 2017-06-04 NOTE — Telephone Encounter (Signed)
Left detailed VM re: language interpreter services needed. Gave patient's MRN, DOB, appt date/time of arrival and time of appt, Spanish language needed, this RN's name, place of patient's appt and call back number.

## 2017-06-05 NOTE — Progress Notes (Signed)
Caroline Garrison  PATIENT: Caroline Garrison DOB: 1981/01/09   REASON FOR VISIT: Follow-up for headaches HISTORY FROM: Patient and interpreter    HISTORY OF PRESENT ILLNESS:UPDATE 3/8/2019CM Caroline Garrison, 37 year old female returns for follow-up with a history of migraine headaches.  She has been doing well with her headaches taking amitriptyline 25 mg at bedtime.  Since last seen she has cut the medication in half due to dryness of the mouth which really has not improved.  She is wanting to taper off the medication to see if her headaches return.  She was made aware that many of her medications for her headaches have side effects.  She is not aware of any food triggers however on questioning she is not aware of which foods would be a problem.  Returns for reevaluation UPDATE 07/12/2018CM patient returns for follow-up with her interpreter for history of headaches. She also has history of hypertension and insomnia and migraine feature headaches. She was placed on amitriptyline at her last visit and is currently taking 25 mg every day. Her headaches are in good control she is pleased with her response. She  is not taking the Flexeril she felt it was ineffective. She returns for reevaluation  HISTORY 04/05/16 Caroline Garrison is a 37 y.o. female here as a referral from Dr. Missy Garrison for tension headaches. Past medical history of hypertension, insomnia, tension headaches and migraines for 3 years. She is non-English speaking and is here with an interpreter. Headaches are in the temple areas, pain doesn't go away, it "comes and goes", since December getting worse, doctor prescribed Propranolol and Trazodone but patient not taking these they did not help. She also feels tension in the neck and in the temples. She has insomnia, she can;t sleep and burning on the top of the head. Headache described as pounding. Also pain in the eyes. She has headaches every day. They can be 2-3/10 but at  it worse it can be 8-9/10. It can last all day long, continuous sje goes to bed with it and wakes up with it. Sometimes it comes and goes throughout the day. No nausea or vomiting. When the headache is very strong the light and noise bothers her. She has burning in the head. She has not had images of the brain. Sister has migraines. She denies stress. She has a lot of tension in the neck. No other focal neurologic deficits, associated symptoms, modifiable factors or inciting events.    REVIEW OF SYSTEMS: Full 14 system review of systems performed and notable only for those listed, all others are neg:  Constitutional: neg  Cardiovascular: neg Ear/Nose/Throat: neg  Skin: neg Eyes: neg Respiratory: neg Gastroitestinal: neg  Hematology/Lymphatic: neg  Endocrine: neg Musculoskeletal:neg Allergy/Immunology: neg Neurological: neg Psychiatric: neg Sleep : neg   ALLERGIES: No Known Allergies  HOME MEDICATIONS: Outpatient Medications Prior to Visit  Medication Sig Dispense Refill  . amitriptyline (ELAVIL) 25 MG tablet Take 1 tablet (25 mg total) by mouth at bedtime. 90 tablet 1  . antiseptic oral rinse (BIOTENE) LIQD 15 mLs by Mouth Rinse route as needed for dry mouth.     No facility-administered medications prior to visit.     PAST MEDICAL HISTORY: Past Medical History:  Diagnosis Date  . Pregnancy induced hypertension     PAST SURGICAL HISTORY: Past Surgical History:  Procedure Laterality Date  . MOUTH SURGERY    . NO PAST SURGERIES      FAMILY HISTORY: Family History  Problem Relation Age of Onset  .  Migraines Sister   . Cancer Neg Hx   . Heart disease Neg Hx   . Hypertension Neg Hx     SOCIAL HISTORY: Social History   Socioeconomic History  . Marital status: Single    Spouse name: Not on file  . Number of children: Not on file  . Years of education: Not on file  . Highest education level: Not on file  Social Needs  . Financial resource strain: Not on file    . Food insecurity - worry: Not on file  . Food insecurity - inability: Not on file  . Transportation needs - medical: Not on file  . Transportation needs - non-medical: Not on file  Occupational History  . Occupation: not working  Tobacco Use  . Smoking status: Never Smoker  . Smokeless tobacco: Never Used  Substance and Sexual Activity  . Alcohol use: No  . Drug use: No  . Sexual activity: Yes    Birth control/protection: None  Other Topics Concern  . Not on file  Social History Narrative   Lives  Home with husband and children, 2 kids.     Caffeine use: 1 cup daily.  No sodas rare.      PHYSICAL EXAM  Vitals:   06/06/17 0947  BP: 121/74  Pulse: 85  Weight: 127 lb (57.6 kg)  Height: 5' (1.524 m)   Body mass index is 24.8 kg/m.  Generalized: Well developed, in no acute distress  Head: normocephalic and atraumatic,. Oropharynx benign  Neck: Supple,    Musculoskeletal: No deformity   Neurological examination   Mentation: Alert oriented to time, place, history taking. Attention span and concentration appropriate. Recent and remote memory intact.  Follows all commands speech and language fluent through the interpreter.   Cranial nerve II-XII: Pupils were equal round reactive to light extraocular movements were full, visual field were full on confrontational test. Facial sensation and strength were normal. hearing was intact to finger rubbing bilaterally. Uvula tongue midline. head turning and shoulder shrug were normal and symmetric.Tongue protrusion into cheek strength was normal. Motor: normal bulk and tone, full strength in the BUE, BLE, fine finger movements normal, no pronator drift. No focal weakness Sensory: normal and symmetric to light touch,   Coordination: finger-nose-finger, heel-to-shin bilaterally, no dysmetria Reflexes: Brachioradialis 2/2, biceps 2/2, triceps 2/2, patellar 2/2, Achilles 2/2, plantar responses were flexor bilaterally. Gait and Station:  Rising up from seated position without assistance, normal stance,  moderate stride, good arm swing, smooth turning, able to perform tiptoe, and heel walking without difficulty. Tandem gait is steady  DIAGNOSTIC DATA (LABS, IMAGING, TESTING) - I reviewed patient records, labs, notes, testing and imaging myself where available.      Component Value Date/Time   NA 139 04/05/2016 1245   K 4.6 04/05/2016 1245   CL 103 04/05/2016 1245   CO2 22 04/05/2016 1245   GLUCOSE 113 (H) 04/05/2016 1245   BUN 8 04/05/2016 1245   CREATININE 0.56 (L) 04/05/2016 1245   CALCIUM 9.2 04/05/2016 1245   GFRNONAA 123 04/05/2016 1245   GFRAA 142 04/05/2016 1245    ASSESSMENT AND PLAN  37 y.o. year old female  has a past medical history of tension-type headaches with migrainous features. CT of the head 04/09/2016 was normal she has been on amitriptyline 25 mg with good results however due to dry mouth she has cut that into half.  Her headaches remain in good control however she wants to stop the medication altogether.  She  is not interested in another medication at this time.The patient is a current patient of Dr. Lucia Gaskins who is out of the office today . This note is sent to the work in doctor.      PLAN: Discontine Amitriptyline due to dryness Avoid headache triggers. If certain foods or odors seem to have triggered your migraines in the past, avoid them.  Reviewed a list of migraine triggers through the interpreter.  A headache diary is helpful if headaches return.   Include physical activity in your daily routine. Try a daily walk or other moderate aerobic exercise.  Manage stress. Find healthy ways to cope with the stressors, such as delegating tasks on your to-do list.  Practice relaxation techniques. Try deep breathing, yoga, massage and visualization.  Eat regularly. Eating regularly scheduled meals and maintaining a healthy diet might help prevent headaches. Also, drink plenty of fluids.  Follow a regular  sleep schedule. Sleep deprivation might contribute to headaches Follow up 6 months All information provided to the patient through interpreter She was also given additional patient education on migraine and this too was reviewed with the interpreter for the patient. I spent 25 minutes in total face to face time with the patient more than 50% of which was spent counseling and coordination of care, reviewing test results reviewing medications and discussing and reviewing the diagnosis of migraine and further treatment options. Nilda Riggs, [facility], [facility], APRN [facility] [address], [address] [place], [place] [address] [phone]

## 2017-06-06 ENCOUNTER — Ambulatory Visit: Payer: Self-pay | Admitting: Nurse Practitioner

## 2017-06-06 ENCOUNTER — Encounter: Payer: Self-pay | Admitting: Nurse Practitioner

## 2017-06-06 VITALS — BP 121/74 | HR 85 | Ht 60.0 in | Wt 127.0 lb

## 2017-06-06 DIAGNOSIS — G44229 Chronic tension-type headache, not intractable: Secondary | ICD-10-CM

## 2017-06-06 NOTE — Progress Notes (Signed)
I reviewed note and agree with plan.   Abrie Egloff R. Wallis Spizzirri, MD 06/06/2017, 1:07 PM Certified in Neurology, Neurophysiology and Neuroimaging  [facility] [address], [address] [place], [place] [address] [phone]  

## 2017-06-06 NOTE — Patient Instructions (Addendum)
Discontine Amitriptyline due to dryness Avoid headache triggers. If certain foods or odors seem to have triggered your migraines in the past, avoid them. A headache diary might help you identify triggers.  Include physical activity in your daily routine. Try a daily walk or other moderate aerobic exercise.  Manage stress. Find healthy ways to cope with the stressors, such as delegating tasks on your to-do list.  Practice relaxation techniques. Try deep breathing, yoga, massage and visualization.  Eat regularly. Eating regularly scheduled meals and maintaining a healthy diet might help prevent headaches. Also, drink plenty of fluids.  Follow a regular sleep schedule. Sleep deprivation might contribute to headaches Follow up 6 months All information provided to the patient through interpreter  Migraine Headache A migraine headache is a very strong throbbing pain on one side or both sides of your head. Migraines can also cause other symptoms. Talk with your doctor about what things may bring on (trigger) your migraine headaches. Follow these instructions at home: Medicines  Take over-the-counter and prescription medicines only as told by your doctor.  Do not drive or use heavy machinery while taking prescription pain medicine.  To prevent or treat constipation while you are taking prescription pain medicine, your doctor may recommend that you: ? Drink enough fluid to keep your pee (urine) clear or pale yellow. ? Take over-the-counter or prescription medicines. ? Eat foods that are high in fiber. These include fresh fruits and vegetables, whole grains, and beans. ? Limit foods that are high in fat and processed sugars. These include fried and sweet foods. Lifestyle  Avoid alcohol.  Do not use any products that contain nicotine or tobacco, such as cigarettes and e-cigarettes. If you need help quitting, ask your doctor.  Get at least 8 hours of sleep every night.  Limit your  stress. General instructions   Keep a journal to find out what may bring on your migraines. For example, write down: ? What you eat and drink. ? How much sleep you get. ? Any change in what you eat or drink. ? Any change in your medicines.  If you have a migraine: ? Avoid things that make your symptoms worse, such as bright lights. ? It may help to lie down in a dark, quiet room. ? Do not drive or use heavy machinery. ? Ask your doctor what activities are safe for you.  Keep all follow-up visits as told by your doctor. This is important. Contact a doctor if:  You get a migraine that is different or worse than your usual migraines. Get help right away if:  Your migraine gets very bad.  You have a fever.  You have a stiff neck.  You have trouble seeing.  Your muscles feel weak or like you cannot control them.  You start to lose your balance a lot.  You start to have trouble walking.  You pass out (faint). This information is not intended to replace advice given to you by your health care provider. Make sure you discuss any questions you have with your health care provider. Document Released: 12/26/2007 Document Revised: 10/06/2015 Document Reviewed: 09/04/2015 Elsevier Interactive Patient Education  2018 [employer]

## 2017-06-18 ENCOUNTER — Telehealth: Payer: Self-pay | Admitting: Nurse Practitioner

## 2017-06-18 NOTE — Telephone Encounter (Signed)
Called husband, [place] and advised him to have wife try OTC Melatonin. He stated he had thought of that. This RN advised that other OTC sleep aids can cause dry mouth. He stated he was going to buy Melatonin and have her try that . He will call back if it does not help. He verbalized understanding, appreciation of call back.

## 2017-06-18 NOTE — Telephone Encounter (Signed)
Pt's husband called since stopping amitriptyline the patient is having difficulty sleeping. He is wanting to discuss a sleep medication. Please call to advise at [phone]. He speaks fluent [place]

## 2017-08-12 ENCOUNTER — Emergency Department (HOSPITAL_COMMUNITY)
Admission: EM | Admit: 2017-08-12 | Discharge: 2017-08-13 | Disposition: A | Discharge status: Home / Self Care | Payer: No Typology Code available for payment source | Attending: Emergency Medicine | Admitting: Emergency Medicine

## 2017-08-12 ENCOUNTER — Encounter (HOSPITAL_COMMUNITY): Payer: Self-pay

## 2017-08-12 DIAGNOSIS — N3001 Acute cystitis with hematuria: Secondary | ICD-10-CM | POA: Insufficient documentation

## 2017-08-12 DIAGNOSIS — R300 Dysuria: Secondary | ICD-10-CM

## 2017-08-12 DIAGNOSIS — Z79899 Other long term (current) drug therapy: Secondary | ICD-10-CM | POA: Insufficient documentation

## 2017-08-12 LAB — URINALYSIS, ROUTINE W REFLEX MICROSCOPIC
BILIRUBIN URINE: NEGATIVE
Glucose, UA: NEGATIVE mg/dL
Ketones, ur: NEGATIVE mg/dL
NITRITE: NEGATIVE
Protein, ur: NEGATIVE mg/dL
SPECIFIC GRAVITY, URINE: 1.005 (ref 1.005–1.030)
pH: 7 (ref 5.0–8.0)

## 2017-08-12 LAB — POC URINE PREG, ED: PREG TEST UR: NEGATIVE

## 2017-08-12 MED ORDER — CEPHALEXIN 500 MG PO CAPS
500.0000 mg | ORAL_CAPSULE | Freq: Once | ORAL | Status: AC
  Administered 2017-08-13: 500 mg via ORAL
  Filled 2017-08-12: qty 1

## 2017-08-12 MED ORDER — PHENAZOPYRIDINE HCL 100 MG PO TABS
100.0000 mg | ORAL_TABLET | Freq: Once | ORAL | Status: AC
  Administered 2017-08-13: 100 mg via ORAL
  Filled 2017-08-12: qty 1

## 2017-08-12 NOTE — ED Triage Notes (Signed)
Pt complains of lower abd pain and painful urination for three days, today she saw blood in her urine

## 2017-08-12 NOTE — ED Provider Notes (Signed)
[facility]-EMERGENCY DEPT Provider Note   CSN: [id number] Arrival date & time: 08/12/17  1916     History   Chief Complaint No chief complaint on file.   HPI Caroline Garrison is a 37 y.o. female.  The history is provided by the patient and medical records.     37 y.o. F here with lower abdominal pain and dysuria x3 days.  Reports hematuria began today.  She has not had any flank pain, no fever, no chills.  States she did try some OTC cystex today without any relief.  Symptoms similar to this 5 years ago with UTI.  Denies pelvic pain, vaginal discharge, abnormal bleeding.  Past Medical History:  Diagnosis Date  . Pregnancy induced hypertension     Patient Active Problem List   Diagnosis Date Noted  . Tension headache, chronic 04/05/2016    Past Surgical History:  Procedure Laterality Date  . MOUTH SURGERY    . NO PAST SURGERIES       OB History    Gravida  2   Para  2   Term  2   Preterm  0   AB  0   Living  2     SAB  0   TAB  0   Ectopic  0   Multiple  0   Live Births  1            Home Medications    Prior to Admission medications   Medication Sig Start Date End Date Taking? Authorizing Provider  amitriptyline (ELAVIL) 25 MG tablet Take 1 tablet (25 mg total) by mouth at bedtime. Patient taking differently: Take 25 mg by mouth at bedtime. Patient to taper over the next month 05/19/17   Nilda Riggs, NP  antiseptic oral rinse (BIOTENE) LIQD 15 mLs by Mouth Rinse route as needed for dry mouth.    [provider]    Family History Family History  Problem Relation Age of Onset  . Migraines Sister   . Cancer Neg Hx   . Heart disease Neg Hx   . Hypertension Neg Hx     Social History Social History   Tobacco Use  . Smoking status: Never Smoker  . Smokeless tobacco: Never Used  Substance Use Topics  . Alcohol use: No  . Drug use: No     Allergies   Amitriptyline   Review of  Systems Review of Systems  Genitourinary: Positive for dysuria.  All other systems reviewed and are negative.    Physical Exam Updated Vital Signs BP 132/85 (BP Location: Left Arm)   Pulse 81   Temp 98.3 F (36.8 C) (Oral)   Resp 18   LMP 07/31/2017   SpO2 100%   Physical Exam  Constitutional: She is oriented to person, place, and time. She appears well-developed and well-nourished.  HENT:  Head: Normocephalic and atraumatic.  Mouth/Throat: Oropharynx is clear and moist.  Eyes: Pupils are equal, round, and reactive to light. Conjunctivae and EOM are normal.  Neck: Normal range of motion.  Cardiovascular: Normal rate, regular rhythm and normal heart sounds.  Pulmonary/Chest: Effort normal and breath sounds normal. No stridor. No respiratory distress.  Abdominal: Soft. Bowel sounds are normal. There is tenderness in the suprapubic area. There is no rebound and no CVA tenderness.  Musculoskeletal: Normal range of motion.  Neurological: She is alert and oriented to person, place, and time.  Skin: Skin is warm and dry.  Psychiatric: She has a  normal mood and affect.  Nursing note and vitals reviewed.    ED Treatments / Results  Labs (all labs ordered are listed, but only abnormal results are displayed) Labs Reviewed  URINALYSIS, ROUTINE W REFLEX MICROSCOPIC - Abnormal; Notable for the following components:      Result Value   Color, Urine STRAW (*)    Hgb urine dipstick LARGE (*)    Leukocytes, UA MODERATE (*)    Bacteria, UA RARE (*)    All other components within normal limits  URINE CULTURE  POC URINE PREG, ED    EKG None  Radiology No results found.  Procedures Procedures (including critical care time)  Medications Ordered in ED Medications  cephALEXin (KEFLEX) capsule 500 mg (has no administration in time range)  phenazopyridine (PYRIDIUM) tablet 100 mg (has no administration in time range)     Initial Impression / Assessment and Plan / ED Course  I  have reviewed the triage vital signs and the nursing notes.  Pertinent labs & imaging results that were available during my care of the patient were reviewed by me and considered in my medical decision making (see chart for details).  37 year old female here with suprapubic abdominal pain, dysuria, hematuria.  He is afebrile and nontoxic.  Abdomen soft, mild tenderness suprapubically.  No CVA tenderness to suggest acute stone or pyelonephritis.  UA consistent with UTI.  Will treat with Keflex and Pyridium.  Close follow-up with PCP.  Discussed plan with patient, she acknowledged understanding and agreed with plan of care.  Return precautions given for new or worsening symptoms.  Final Clinical Impressions(s) / ED Diagnoses   Final diagnoses:  Acute cystitis with hematuria  Dysuria    ED Discharge Orders        Ordered    cephALEXin (KEFLEX) 500 MG capsule  3 times daily     08/13/17 0025    phenazopyridine (PYRIDIUM) 200 MG tablet  3 times daily PRN     08/13/17 0025       Garlon Hatchet, PA-C 08/13/17 [place] Bruns    Shaune Pollack, MD 08/13/17 1015

## 2017-08-13 MED ORDER — PHENAZOPYRIDINE HCL 200 MG PO TABS: 200.0000 mg | ORAL_TABLET | Freq: Three times a day (TID) | ORAL | 0 refills | Status: DC | PRN

## 2017-08-13 MED ORDER — CEPHALEXIN 500 MG PO CAPS: 500.0000 mg | ORAL_CAPSULE | Freq: Three times a day (TID) | ORAL | 0 refills | Status: DC

## 2017-08-13 NOTE — Discharge Instructions (Signed)
Take the prescribed medication as directed.  Make sure to drink plenty of water. Follow-up with your primary care doctor. Return to the ED for new or worsening symptoms. 

## 2017-08-14 LAB — URINE CULTURE: CULTURE: NO GROWTH

## 2017-10-13 ENCOUNTER — Ambulatory Visit: Payer: Self-pay | Admitting: Nurse Practitioner

## 2017-12-08 NOTE — Progress Notes (Signed)
[facility]  PATIENT: Caroline Garrison DOB: [birth year]   REASON FOR VISIT: Follow-up for headaches HISTORY FROM: Patient and interpreter    HISTORY OF PRESENT ILLNESS:UPDATE 9/10/2019CM [name], 37 year old female returns for follow-up with a history of migraine headaches.  When last seen she wanted to stop her amitriptyline due to dry mouth which she did.  She did not want to be placed on another preventive at that time.  Now she is having daily headache again.  She has a headache described as pounding she has sensitivity to light and noise.  She can have a burning sensation in the head she also has some neck pain associated with this.  She can have dizziness the headache is really bad.  She has had headaches for approximately 4 years.  She has failed propanolol trazodone nortriptyline.  She has a normal neurologic exam.  CT of the head was normal in January 2018.  Her sister has migraines.  She denies any food triggers.  Her primary care who placed her on some sumatriptan 25 mg as needed however the patient claims it makes the headaches worse.  She returns for reevaluation.  She has multiple questions on exam today which are answered through her interpreter. UPDATE 3/8/2019CM [name], 37 year old female returns for follow-up with a history of migraine headaches.  She has been doing well with her headaches taking amitriptyline 25 mg at bedtime.  Since last seen she has cut the medication in half due to dryness of the mouth which really has not improved.  She is wanting to taper off the medication to see if her headaches return.  She was made aware that many of her medications for her headaches have side effects.  She is not aware of any food triggers however on questioning she is not aware of which foods would be a problem.  Returns for reevaluation UPDATE 07/12/2018CM patient returns for follow-up with her interpreter for history of headaches. She also has history  of hypertension and insomnia and migraine feature headaches. She was placed on amitriptyline at her last visit and is currently taking 25 mg every day. Her headaches are in good control she is pleased with her response. She  is not taking the Flexeril she felt it was ineffective. She returns for reevaluation  HISTORY 04/05/16 Caroline Garrison is a 37 y.o. female here as a referral from Dr. Missy Sabins for tension headaches. Past medical history of hypertension, insomnia, tension headaches and migraines for 3 years. She is non-English speaking and is here with an interpreter. Headaches are in the temple areas, pain doesn't go away, it "comes and goes", since December getting worse, doctor prescribed Propranolol and Trazodone but patient not taking these they did not help. She also feels tension in the neck and in the temples. She has insomnia, she can;t sleep and burning on the top of the head. Headache described as pounding. Also pain in the eyes. She has headaches every day. They can be 2-3/10 but at it worse it can be 8-9/10. It can last all day long, continuous sje goes to bed with it and wakes up with it. Sometimes it comes and goes throughout the day. No nausea or vomiting. When the headache is very strong the light and noise bothers her. She has burning in the head. She has not had images of the brain. Sister has migraines. She denies stress. She has a lot of tension in the neck. No other focal neurologic deficits, associated symptoms, modifiable factors or inciting  events.    REVIEW OF SYSTEMS: Full 14 system review of systems performed and notable only for those listed, all others are neg:  Constitutional: Activity change Cardiovascular: neg Ear/Nose/Throat: neg  Skin: neg Eyes:  light sensitivity, blurred vision Respiratory: neg Gastroitestinal: neg  Hematology/Lymphatic: neg  Endocrine: neg Musculoskeletal: Neck pain Allergy/Immunology: neg Neurological: Headache dizziness Psychiatric:  neg Sleep : Shift work   ALLERGIES: Allergies  Allergen Reactions  . Amitriptyline Other (See Comments)    Dry mouth, dryness    HOME MEDICATIONS: Outpatient Medications Prior to Visit  Medication Sig Dispense Refill  . cyclobenzaprine (FLEXERIL) 10 MG tablet Take 10 mg by mouth as needed for muscle spasms (at bedtime as needed).    . IBU 800 MG tablet TAKE ONE TABLET BY MOUTH THREE TIMES DAILY AS NEEDED FOR PAIN WITH FOOD  0  . SUMAtriptan (IMITREX) 25 MG tablet Take 25 mg by mouth every 2 (two) hours as needed for migraine. May repeat in 2 hours if headache persists or recurs.    Marland Kitchen amitriptyline (ELAVIL) 25 MG tablet Take 1 tablet (25 mg total) by mouth at bedtime. (Patient not taking: Reported on 12/09/2017) 90 tablet 1  . cephALEXin (KEFLEX) 500 MG capsule Take 1 capsule (500 mg total) by mouth 3 (three) times daily. (Patient not taking: Reported on 12/09/2017) 21 capsule 0  . phenazopyridine (PYRIDIUM) 200 MG tablet Take 1 tablet (200 mg total) by mouth 3 (three) times daily as needed for pain. (Patient not taking: Reported on 12/09/2017) 9 tablet 0   No facility-administered medications prior to visit.     PAST MEDICAL HISTORY: Past Medical History:  Diagnosis Date  . Headache   . Pregnancy induced hypertension     PAST SURGICAL HISTORY: Past Surgical History:  Procedure Laterality Date  . MOUTH SURGERY    . NO PAST SURGERIES      FAMILY HISTORY: Family History  Problem Relation Age of Onset  . Migraines Sister   . Cancer Neg Hx   . Heart disease Neg Hx   . Hypertension Neg Hx     SOCIAL HISTORY: Social History   Socioeconomic History  . Marital status: Married    Spouse name: Not on file  . Number of children: Not on file  . Years of education: Not on file  . Highest education level: Not on file  Occupational History  . 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 Financial resource strain: Not on file  . Food insecurity:    Worry: Not on file     Inability: Not on file  . Transportation needs:    Medical: Not on file    Non-medical: Not on file  Tobacco Use  . Smoking status: Never Smoker  . Smokeless tobacco: Never Used  Substance and Sexual Activity  . Alcohol use: No  . Drug use: No  . Sexual activity: Yes    Birth control/protection: None  Lifestyle  . Physical activity:    Days per week: Not on file    Minutes per session: Not on file  . Stress: Not on file  Relationships  . Social connections:    Talks on phone: Not on file    Gets together: Not on file    Attends religious service: Not on file    Active member of club or organization: Not on file    Attends meetings of clubs or organizations: Not on file    Relationship status: Not on file  . Intimate partner  violence:    Fear of current or ex partner: Not on file    Emotionally abused: Not on file    Physically abused: Not on file    Forced sexual activity: Not on file  Other Topics Concern  . Not on file  Social History Narrative   Lives  Home with husband and children, 2 kids.     Caffeine use: 1 cup daily.  No sodas rare.      PHYSICAL EXAM  Vitals:   12/09/17 0937  BP: 106/74  Pulse: 74  Weight: 124 lb 6.4 oz (56.4 kg)  Height: 5' (1.524 m)   Body mass index is 24.3 kg/m.  Generalized: Well developed, in no acute distress  Head: normocephalic and atraumatic,. Oropharynx benign  Neck: Supple,    Musculoskeletal: No deformity   Neurological examination   Mentation: Alert oriented to time, place, history taking. Attention span and concentration appropriate. Recent and remote memory intact.  Follows all commands speech and language fluent through the interpreter.   Cranial nerve II-XII: Pupils were equal round reactive to light extraocular movements were full, visual field were full on confrontational test. Facial sensation and strength were normal. hearing was intact to finger rubbing bilaterally. Uvula tongue midline. head turning and  shoulder shrug were normal and symmetric.Tongue protrusion into cheek strength was normal. Motor: normal bulk and tone, full strength in the BUE, BLE, fine finger movements normal, no pronator drift. No focal weakness Sensory: normal and symmetric to light touch,   Coordination: finger-nose-finger, heel-to-shin bilaterally, no dysmetria Reflexes: Brachioradialis 2/2, biceps 2/2, triceps 2/2, patellar 2/2, Achilles 2/2, plantar responses were flexor bilaterally. Gait and Station: Rising up from seated position without assistance, normal stance,  moderate stride, good arm swing, smooth turning, able to perform tiptoe, and heel walking without difficulty. Tandem gait is steady  DIAGNOSTIC DATA (LABS, IMAGING, TESTING) - I reviewed patient records, labs, notes, testing and imaging myself where available.      Component Value Date/Time   NA 139 04/05/2016 1245   K 4.6 04/05/2016 1245   CL 103 04/05/2016 1245   CO2 22 04/05/2016 1245   GLUCOSE 113 (H) 04/05/2016 1245   BUN 8 04/05/2016 1245   CREATININE 0.56 (L) 04/05/2016 1245   CALCIUM 9.2 04/05/2016 1245   GFRNONAA 123 04/05/2016 1245   GFRAA 142 04/05/2016 1245    ASSESSMENT AND PLAN  37 y.o. year old female  has a past medical history of tension-type headaches with migrainous features. CT of the head 04/09/2016 was normal she has been on amitriptyline 25 mg with good results however due to dry mouth she has cut that into half then discontinued.   Now her  headaches are worse.  She has had side effects to propranolol and trazodone also.  Her primary care recently placed her on sumatriptan 25 mg and she states she had side effects to that make her headache worse.       PLAN: Flexeril 10mg  every night if too drowsy on that dose take 1/2 tab. Every night Relpax 40mg  acutely Avoid headache triggers. If certain foods or odors seem to have triggered your migraines in the past, avoid them.  Reviewed a list of migraine triggers through the  interpreter.  A headache diary is helpful if headaches return.   Include physical activity in your daily routine. Try a daily walk or other moderate aerobic exercise.  Manage stress. Find healthy ways to cope with the stressors, such as delegating tasks on your to-do  list.  Practice relaxation techniques. Try deep breathing, yoga, massage and visualization.  Eat regularly. Eating regularly scheduled meals and maintaining a healthy diet might help prevent headaches. Also, drink plenty of fluids.  Follow a regular sleep schedule. Sleep deprivation might contribute to headaches Follow up 6 months next with Dr. Lucia Gaskins All information provided to the patient through interpreter She was also given additional patient education on migraine and this too was reviewed with the interpreter for the patient. I spent 45 minutes in total face to face time with the patient more than 50% of which was spent counseling and coordination of care, reviewing test results reviewing medications and discussing and reviewing the diagnosis of migraine and further treatment options.  Multiple questions were answered Nilda Riggs, [facility], [facility], APRN [facility] [address], [address] [place], [place] [address] [phone]

## 2017-12-09 ENCOUNTER — Encounter: Payer: Self-pay | Admitting: Nurse Practitioner

## 2017-12-09 ENCOUNTER — Ambulatory Visit: Payer: Self-pay | Admitting: Nurse Practitioner

## 2017-12-09 VITALS — BP 106/74 | HR 74 | Ht 60.0 in | Wt 124.4 lb

## 2017-12-09 DIAGNOSIS — G44229 Chronic tension-type headache, not intractable: Secondary | ICD-10-CM

## 2017-12-09 MED ORDER — ELETRIPTAN HYDROBROMIDE 40 MG PO TABS: 40.0000 mg | ORAL_TABLET | ORAL | 6 refills | Status: DC | PRN

## 2017-12-09 MED ORDER — CYCLOBENZAPRINE HCL 10 MG PO TABS: 10.0000 mg | ORAL_TABLET | Freq: Every day | ORAL | 6 refills | Status: AC

## 2017-12-09 NOTE — Progress Notes (Signed)
Made any corrections needed, and agree with history, physical, neuro exam,assessment and plan as stated above.     Antonia Ahern, MD [facility] 

## 2017-12-09 NOTE — Patient Instructions (Signed)
Flexeril 10mg  every night if too drowsy on that dose take 1/2 tab.  Imitrex 25 mg acutely prn Avoid headache triggers. If certain foods or odors seem to have triggered your migraines in the past, avoid them.  Reviewed a list of migraine triggers through the interpreter.  A headache diary is helpful if headaches return.   Include physical activity in your daily routine. Try a daily walk or other moderate aerobic exercise.  Manage stress. Find healthy ways to cope with the stressors, such as delegating tasks on your to-do list.  Practice relaxation techniques. Try deep breathing, yoga, massage and visualization.  Eat regularly. Eating regularly scheduled meals and maintaining a healthy diet might help prevent headaches. Also, drink plenty of fluids.  Follow a regular sleep schedule. Sleep deprivation might contribute to headaches Follow up 6 months next with Dr. Lucia Gaskins

## 2017-12-10 ENCOUNTER — Telehealth: Payer: Self-pay | Admitting: Nurse Practitioner

## 2017-12-10 ENCOUNTER — Telehealth: Payer: Self-pay | Admitting: Neurology

## 2017-12-10 MED ORDER — RIZATRIPTAN BENZOATE 5 MG PO TABS: 5.0000 mg | ORAL_TABLET | ORAL | 1 refills | Status: AC | PRN

## 2017-12-10 NOTE — Telephone Encounter (Signed)
Pt husband(on [place],Jorge  [phone])has called SN:KNLZJQBHAL (RELPAX) 40 MG tablet he says this medication is $280.00 for 10 pills, very expensive.  Husband would like to know if something less expensive is available.  He states even with the coupon is around $100.00 for 10 pills.  Please call

## 2017-12-10 NOTE — Telephone Encounter (Signed)
Husband called Alba Cory ) reported that triptans are too expensive- can she stay on Amitriptyline?   I told him yes, she can.

## 2017-12-10 NOTE — Addendum Note (Signed)
Addended by: Lynder Parents on: 12/10/2017 01:58 PM   Modules accepted: Orders

## 2017-12-10 NOTE — Telephone Encounter (Signed)
Spoke with husband Donald Pore, on [place] and informed him that Enid Skeens, NP sent in a new prescription. Advised him this RN spoke with Morrie Sheldon at [facility] who stated she would run new Rx with discount card. Advised him that hopefully this medication will be affordable. He verbalized understanding, appreciation.

## 2017-12-10 NOTE — Telephone Encounter (Addendum)
[employer], spoke with Morrie Sheldon and inquired about patient's insurance. She has no insurance. [facility] has discount cards, but even with the card the Relpax cost is  $85.20 for 10 tabs. This RN asked what patient's cost would be for rizatriptan. Morrie Sheldon stated she would need prescription before she could run it for cost to patient with discount card. Will route to NP for advice.

## 2017-12-10 NOTE — Telephone Encounter (Signed)
I have ordered Maxalt hope that works. Cant believe she doesn't know cash price.

## 2017-12-15 MED ORDER — AMITRIPTYLINE HCL 25 MG PO TABS: 25.0000 mg | ORAL_TABLET | Freq: Every day | ORAL | 6 refills | Status: AC

## 2017-12-15 NOTE — Telephone Encounter (Signed)
Medication was renewed.

## 2018-06-09 ENCOUNTER — Ambulatory Visit: Payer: Self-pay | Admitting: Neurology

## 2018-11-07 ENCOUNTER — Encounter (HOSPITAL_COMMUNITY): Payer: Self-pay | Admitting: Emergency Medicine

## 2018-11-07 ENCOUNTER — Emergency Department (HOSPITAL_COMMUNITY): Payer: Self-pay

## 2018-11-07 ENCOUNTER — Emergency Department (HOSPITAL_COMMUNITY)
Admission: EM | Admit: 2018-11-07 | Discharge: 2018-11-07 | Disposition: A | Discharge status: Home / Self Care | Payer: Self-pay | Attending: Emergency Medicine | Admitting: Emergency Medicine

## 2018-11-07 ENCOUNTER — Other Ambulatory Visit: Payer: Self-pay

## 2018-11-07 DIAGNOSIS — R102 Pelvic and perineal pain: Secondary | ICD-10-CM | POA: Insufficient documentation

## 2018-11-07 DIAGNOSIS — R1031 Right lower quadrant pain: Secondary | ICD-10-CM | POA: Insufficient documentation

## 2018-11-07 DIAGNOSIS — Z888 Allergy status to other drugs, medicaments and biological substances status: Secondary | ICD-10-CM | POA: Insufficient documentation

## 2018-11-07 DIAGNOSIS — R1032 Left lower quadrant pain: Secondary | ICD-10-CM | POA: Insufficient documentation

## 2018-11-07 DIAGNOSIS — Z79899 Other long term (current) drug therapy: Secondary | ICD-10-CM | POA: Insufficient documentation

## 2018-11-07 LAB — URINALYSIS, ROUTINE W REFLEX MICROSCOPIC
Bacteria, UA: NONE SEEN
Bilirubin Urine: NEGATIVE
Glucose, UA: NEGATIVE mg/dL
Ketones, ur: 5 mg/dL — AB
Nitrite: NEGATIVE
Protein, ur: NEGATIVE mg/dL
Specific Gravity, Urine: 1.011 (ref 1.005–1.030)
pH: 5 (ref 5.0–8.0)

## 2018-11-07 LAB — LIPASE, BLOOD: Lipase: 34 U/L (ref 11–51)

## 2018-11-07 LAB — COMPREHENSIVE METABOLIC PANEL
ALT: 25 U/L (ref 0–44)
AST: 21 U/L (ref 15–41)
Albumin: 4.3 g/dL (ref 3.5–5.0)
Alkaline Phosphatase: 76 U/L (ref 38–126)
Anion gap: 10 (ref 5–15)
BUN: 12 mg/dL (ref 6–20)
CO2: 20 mmol/L — ABNORMAL LOW (ref 22–32)
Calcium: 9.1 mg/dL (ref 8.9–10.3)
Chloride: 107 mmol/L (ref 98–111)
Creatinine, Ser: 0.63 mg/dL (ref 0.44–1.00)
GFR calc Af Amer: >60 mL/min (ref >60)
GFR calc non Af Amer: >60 mL/min (ref >60)
Glucose, Bld: 109 mg/dL — ABNORMAL HIGH (ref 70–99)
Potassium: 3.8 mmol/L (ref 3.5–5.1)
Sodium: 137 mmol/L (ref 135–145)
Total Bilirubin: 0.7 mg/dL (ref 0.3–1.2)
Total Protein: 8.6 g/dL — ABNORMAL HIGH (ref 6.5–8.1)

## 2018-11-07 LAB — CBC
HCT: 41.3 % (ref 36.0–46.0)
Hemoglobin: 13.8 g/dL (ref 12.0–15.0)
MCH: 30.3 pg (ref 26.0–34.0)
MCHC: 33.4 g/dL (ref 30.0–36.0)
MCV: 90.6 fL (ref 80.0–100.0)
Platelets: 268 10*3/uL (ref 150–400)
RBC: 4.56 MIL/uL (ref 3.87–5.11)
RDW: 11.9 % (ref 11.5–15.5)
WBC: 6.6 10*3/uL (ref 4.0–10.5)
nRBC: 0 % (ref 0.0–0.2)

## 2018-11-07 LAB — I-STAT BETA HCG BLOOD, ED (MC, WL, AP ONLY): I-stat hCG, quantitative: <5 m[IU]/mL (ref <5)

## 2018-11-07 MED ORDER — SODIUM CHLORIDE 0.9% FLUSH
3.0000 mL | Freq: Once | INTRAVENOUS | Status: AC
  Administered 2018-11-07: 3 mL via INTRAVENOUS

## 2018-11-07 MED ORDER — IOHEXOL 300 MG/ML  SOLN
100.0000 mL | Freq: Once | INTRAMUSCULAR | Status: AC | PRN
  Administered 2018-11-07: 100 mL via INTRAVENOUS

## 2018-11-07 MED ORDER — KETOROLAC TROMETHAMINE 15 MG/ML IJ SOLN
15.0000 mg | Freq: Once | INTRAMUSCULAR | Status: AC
  Administered 2018-11-07: 15 mg via INTRAVENOUS
  Filled 2018-11-07: qty 1

## 2018-11-07 NOTE — ED Notes (Signed)
Patient transported to CT 

## 2018-11-07 NOTE — ED Notes (Signed)
Patient Alert and oriented to baseline. Stable and ambulatory to baseline. Patient verbalized understanding of the discharge instructions.  Patient belongings were taken by the patient.   

## 2018-11-07 NOTE — ED Triage Notes (Signed)
Pt reports lower abdominal pain x 1 week. Pt denies N/V or vaginal bleeding.

## 2018-11-07 NOTE — ED Provider Notes (Signed)
[facility] EMERGENCY DEPARTMENT Provider Note   CSN: [id number] Arrival date & time: 11/07/18  1429     History   Chief Complaint Chief Complaint  Patient presents with  . Abdominal Pain    HPI Caroline Garrison is a 38 y.o. female.  Presents emerged department chief complaint of lower abdominal pain for the past week.  Patient reports symptoms have been going on for approximately 1 week.  Initially in her left lower abdomen, now throughout her lower abdomen, pelvic area.  She denies any nausea, vomiting.  No vaginal bleeding, no vaginal discharge.  States that she went to an outpatient clinic Thursday.  At that time they performed a pelvic examination and sent for gonorrhea and chlamydia.  Patient reports that they were concerned about having a STD and started her on treatment.  Patient has prescription for doxycycline, Flagyl which she reports compliance with.  States that she is supposed to follow-up with them for an outpatient ultrasound to further assess.  Patient states pain has been constant, currently 7 out of 10 in severity.  Had been taking some naproxen with intermittent relief.  Nonradiating.  Denies prior surgical history, denies past significant medical history.  Patient Spanish only speaking, utilized interpreter services for all pertinent parts of history, physical, review of results and plan of disposition.     HPI  Past Medical History:  Diagnosis Date  . Headache   . Pregnancy induced hypertension     Patient Active Problem List   Diagnosis Date Noted  . Tension headache, chronic 04/05/2016    Past Surgical History:  Procedure Laterality Date  . MOUTH SURGERY    . NO PAST SURGERIES       OB History    Gravida  2   Para  2   Term  2   Preterm  0   AB  0   Living  2     SAB  0   TAB  0   Ectopic  0   Multiple  0   Live Births  1            Home Medications    Prior to Admission medications   Medication  Sig Start Date End Date Taking? Authorizing Provider  amitriptyline (ELAVIL) 25 MG tablet Take 1 tablet (25 mg total) by mouth at bedtime. 12/15/17   Dennie Bible, NP  cephALEXin (KEFLEX) 500 MG capsule Take 1 capsule (500 mg total) by mouth 3 (three) times daily. Patient not taking: Reported on 12/09/2017 08/13/17   Larene Pickett, PA-C  cyclobenzaprine (FLEXERIL) 10 MG tablet Take 1 tablet (10 mg total) by mouth at bedtime. 12/09/17   Dennie Bible, NP  IBU 800 MG tablet TAKE ONE TABLET BY MOUTH THREE TIMES DAILY AS NEEDED FOR PAIN WITH FOOD 12/05/17   [provider]  rizatriptan (MAXALT) 5 MG tablet Take 1 tablet (5 mg total) by mouth as needed for migraine. May repeat in 2 hours if needed 12/10/17   Dennie Bible, NP    Family History Family History  Problem Relation Age of Onset  . Migraines Sister   . Cancer Neg Hx   . Heart disease Neg Hx   . Hypertension Neg Hx     Social History Social History   Tobacco Use  . Smoking status: Never Smoker  . Smokeless tobacco: Never Used  Substance Use Topics  . Alcohol use: No  . Drug use: No  Allergies   Amitriptyline   Review of Systems Review of Systems  Constitutional: Negative for chills and fever.  HENT: Negative for ear pain and sore throat.   Eyes: Negative for pain and visual disturbance.  Respiratory: Negative for cough and shortness of breath.   Cardiovascular: Negative for chest pain and palpitations.  Gastrointestinal: Positive for abdominal pain. Negative for vomiting.  Genitourinary: Negative for dysuria and hematuria.  Musculoskeletal: Negative for arthralgias and back pain.  Skin: Negative for color change and rash.  Neurological: Negative for seizures and syncope.  All other systems reviewed and are negative.    Physical Exam Updated Vital Signs BP (!) 166/85 (BP Location: Right Arm)   Pulse 98   Temp 99.3 F (37.4 C) (Oral)   Resp 16   SpO2 100%   Physical Exam  Vitals signs and nursing note reviewed.  Constitutional:      General: She is not in acute distress.    Appearance: She is well-developed.  HENT:     Head: Normocephalic and atraumatic.  Eyes:     Conjunctiva/sclera: Conjunctivae normal.  Neck:     Musculoskeletal: Neck supple.  Cardiovascular:     Rate and Rhythm: Normal rate and regular rhythm.     Heart sounds: No murmur.  Pulmonary:     Effort: Pulmonary effort is normal. No respiratory distress.     Breath sounds: Normal breath sounds.  Abdominal:     Palpations: Abdomen is soft.     Comments: Mild tenderness palpation in left lower quadrant, mid abdomen  Skin:    General: Skin is warm and dry.  Neurological:     Mental Status: She is alert.      ED Treatments / Results  Labs (all labs ordered are listed, but only abnormal results are displayed) Labs Reviewed  COMPREHENSIVE METABOLIC PANEL - Abnormal; Notable for the following components:      Result Value   CO2 20 (*)    Glucose, Bld 109 (*)    Total Protein 8.6 (*)    All other components within normal limits  LIPASE, BLOOD  CBC  URINALYSIS, ROUTINE W REFLEX MICROSCOPIC  I-STAT BETA HCG BLOOD, ED ([facility], [facility], [facility] ONLY)    EKG None  Radiology No results found.  Procedures Procedures (including critical care time)  Medications Ordered in ED Medications  sodium chloride flush (NS) 0.9 % injection 3 mL (has no administration in time range)     Initial Impression / Assessment and Plan / ED Course  I have reviewed the triage vital signs and the nursing notes.  Pertinent labs & imaging results that were available during my care of the patient were reviewed by me and considered in my medical decision making (see chart for details).        38 year old lady who presents emergency department with chief complaint of lower abdominal pain.  Reports that she was seen in outpatient clinic where they performed a pelvic exam and check for STDs.  Patient states  compliance with the prescribed antibiotics though she does not know results from the testing.  Labs here are unremarkable, no white count.  Ultrasound is negative for TOA, torsion, ovarian cysts.  Patient still complaining of some lower abdominal pain.  Will check CT scan to rule out acute abdominal pathology.  At time of signout CT scan pending.  If CT negative will continue with previously prescribed antibiotic therapy and recommend recheck with primary doctor.  If CT shows acute abdominal process or  other concerning finding, please refer to PA Khatri's note.  Final Clinical Impressions(s) / ED Diagnoses   Final diagnoses:  Left lower quadrant abdominal pain  Abdominal pain, right lower quadrant    ED Discharge Orders    None       Milagros Lollykstra, Dusan Lipford S, MD 11/07/18 [phone]

## 2018-11-07 NOTE — Discharge Instructions (Addendum)
Please continue taking the antibiotics as previously prescribed.  Recommend taking anti-inflammatory such as naproxen that was prescribed to you.  If you develop worsening pain, vomiting, fevers please return to the ER for reassessment. Your CT shows a 3 cm ovarian cyst on the left ovary.  This could be normal.  However you will need a repeat ultrasound done in 6 to 8 weeks to see if it has resolved. Please follow-up with your primary care provider or OB/GYN for further evaluation.  Contine tomando los antibiticos segn lo prescrito anteriormente. Recomiende tomar antiinflamatorios como el naproxeno que le hayan recetado. Si [employer], los vmitos o la fiebre, regrese a la sala de emergencias para una nueva evaluacin. Su TC muestra un quiste ovrico de 3 cm en el ovario izquierdo. Esto podra ser normal. Sin embargo, necesitar repetir la ecografa en 6 a 8 semanas para ver si se ha resuelto. Haga un seguimiento con su proveedor de atencin primaria u Producer, television/film/video / gineclogo para Educational psychologist.

## 2018-11-07 NOTE — ED Notes (Signed)
Patient transported to Ultrasound 

## 2018-11-07 NOTE — ED Provider Notes (Signed)
Physical Exam  BP 123/82   Pulse 80   Temp 99.3 F (37.4 C) (Oral)   Resp (!) 22   Ht 5' (1.524 m)   Wt 56.4 kg   SpO2 100%   BMI 24.28 kg/m   Physical Exam Vitals signs and nursing note reviewed.  Constitutional:      General: She is not in acute distress.    Appearance: She is well-developed. She is not diaphoretic.  HENT:     Head: Normocephalic and atraumatic.  Eyes:     General: No scleral icterus.    Conjunctiva/sclera: Conjunctivae normal.  Neck:     Musculoskeletal: Normal range of motion.  Pulmonary:     Effort: Pulmonary effort is normal. No respiratory distress.  Skin:    Findings: No rash.  Neurological:     Mental Status: She is alert.     ED Course/Procedures     Procedures  MDM  Care handed off from previous provider Dr. Stevie Kernykstra.  Please see their note for further detail.  Briefly, patient is a 38 year old female presenting to ED for 1 week history of lower abdominal pain.  Denies vaginal bleeding or vaginal discharge.  She went to clinic on Thursday, was concerned for gonorrhea and chlamydia and prescribed antibiotics.  She has been taking medications but has not gotten any results.  She continues to have pain with some improvement naproxen.  Lab work here is reassuring.  Ultrasound of the pelvis is negative.  CT of the abdomen and pelvis is pending at this time to evaluate further for this lower abdominal pain.  If negative, patient can be discharged home.  We will set disposition according to CT results.  [place] Transvaginal Non-ob  Result Date: 11/07/2018 CLINICAL DATA:  Pelvic pain. Tenderness to palpation in the left lower quadrant, right lower quadrant. Concern for possible tubo-ovarian abscess. EXAM: TRANSABDOMINAL AND TRANSVAGINAL ULTRASOUND OF PELVIS DOPPLER ULTRASOUND OF OVARIES TECHNIQUE: Both transabdominal and transvaginal ultrasound examinations of the pelvis were performed. Transabdominal technique was performed for global imaging of the pelvis  including uterus, ovaries, adnexal regions, and pelvic cul-de-sac. It was necessary to proceed with endovaginal exam following the transabdominal exam to visualize the ovaries. Color and duplex Doppler ultrasound was utilized to evaluate blood flow to the ovaries. COMPARISON:  None. FINDINGS: Uterus Measurements: 7.3 x 3.7 x 4.5 cm = volume: 65 mL. No fibroids or other mass visualized. Endometrium Thickness: 11 mm.  No focal abnormality visualized. Right ovary Measurements: 2.5 x 1.6 x 1.7 cm = volume: 3.5 mL. Normal appearance/no adnexal mass. Left ovary Measurements: 3.5 x 2 x 2.9 cm = volume: 10.9 mL. There is a 2.3 cm simple appearing cyst. Pulsed Doppler evaluation of both ovaries demonstrates normal low-resistance arterial and venous waveforms. Other findings No abnormal free fluid. IMPRESSION: No acute sonographic abnormality detected. No findings to explain the patient's pelvic pain. Electronically Signed   By: Katherine Mantlehristopher  Green M.D.   On: 11/07/2018 18:30   [place] Pelvis Complete  Result Date: 11/07/2018 CLINICAL DATA:  Pelvic pain. Tenderness to palpation in the left lower quadrant, right lower quadrant. Concern for possible tubo-ovarian abscess. EXAM: TRANSABDOMINAL AND TRANSVAGINAL ULTRASOUND OF PELVIS DOPPLER ULTRASOUND OF OVARIES TECHNIQUE: Both transabdominal and transvaginal ultrasound examinations of the pelvis were performed. Transabdominal technique was performed for global imaging of the pelvis including uterus, ovaries, adnexal regions, and pelvic cul-de-sac. It was necessary to proceed with endovaginal exam following the transabdominal exam to visualize the ovaries. Color and duplex Doppler ultrasound was  utilized to evaluate blood flow to the ovaries. COMPARISON:  None. FINDINGS: Uterus Measurements: 7.3 x 3.7 x 4.5 cm = volume: 65 mL. No fibroids or other mass visualized. Endometrium Thickness: 11 mm.  No focal abnormality visualized. Right ovary Measurements: 2.5 x 1.6 x 1.7 cm = volume: 3.5  mL. Normal appearance/no adnexal mass. Left ovary Measurements: 3.5 x 2 x 2.9 cm = volume: 10.9 mL. There is a 2.3 cm simple appearing cyst. Pulsed Doppler evaluation of both ovaries demonstrates normal low-resistance arterial and venous waveforms. Other findings No abnormal free fluid. IMPRESSION: No acute sonographic abnormality detected. No findings to explain the patient's pelvic pain. Electronically Signed   By: Katherine Mantlehristopher  Green M.D.   On: 11/07/2018 18:30   Ct Abdomen Pelvis W Contrast  Result Date: 11/07/2018 CLINICAL DATA:  Abdominal pain for 1 week. EXAM: CT ABDOMEN AND PELVIS WITH CONTRAST TECHNIQUE: Multidetector CT imaging of the abdomen and pelvis was performed using the standard protocol following bolus administration of intravenous contrast. CONTRAST:  100mL OMNIPAQUE IOHEXOL 300 MG/ML  SOLN COMPARISON:  None. FINDINGS: Lower chest: No acute abnormality. Hepatobiliary: No focal liver abnormality is seen. No gallstones, gallbladder wall thickening, or biliary dilatation. Pancreas: Unremarkable. No pancreatic ductal dilatation or surrounding inflammatory changes. Spleen: Normal in size without focal abnormality. Adrenals/Urinary Tract: Adrenal glands are unremarkable. Kidneys are normal, without renal calculi, focal lesion, or hydronephrosis. Bladder is unremarkable. Stomach/Bowel: Stomach is within normal limits. Appendix appears normal. No evidence of bowel wall thickening, distention, or inflammatory changes. Vascular/Lymphatic: No significant vascular findings are present. No enlarged abdominal or pelvic lymph nodes. Reproductive: Normal appearance of the uterus and right adnexa. 3.1 cm left ovarian cyst, possibly physiologic. Other: No abdominal wall hernia or abnormality. No abdominopelvic ascites. Musculoskeletal: No acute or significant osseous findings. IMPRESSION: 1. No evidence of acute abnormalities within the abdomen or pelvis. 2. 3.1 cm left ovarian cyst, possibly physiologic.  Follow-up with pelvic ultrasound in 6-8 weeks may be considered. Electronically Signed   By: Ted Mcalpineobrinka  Dimitrova M.D.   On: 11/07/2018 20:11   [place] Art/ven Flow Abd Pelv Doppler  Result Date: 11/07/2018 CLINICAL DATA:  Pelvic pain. Tenderness to palpation in the left lower quadrant, right lower quadrant. Concern for possible tubo-ovarian abscess. EXAM: TRANSABDOMINAL AND TRANSVAGINAL ULTRASOUND OF PELVIS DOPPLER ULTRASOUND OF OVARIES TECHNIQUE: Both transabdominal and transvaginal ultrasound examinations of the pelvis were performed. Transabdominal technique was performed for global imaging of the pelvis including uterus, ovaries, adnexal regions, and pelvic cul-de-sac. It was necessary to proceed with endovaginal exam following the transabdominal exam to visualize the ovaries. Color and duplex Doppler ultrasound was utilized to evaluate blood flow to the ovaries. COMPARISON:  None. FINDINGS: Uterus Measurements: 7.3 x 3.7 x 4.5 cm = volume: 65 mL. No fibroids or other mass visualized. Endometrium Thickness: 11 mm.  No focal abnormality visualized. Right ovary Measurements: 2.5 x 1.6 x 1.7 cm = volume: 3.5 mL. Normal appearance/no adnexal mass. Left ovary Measurements: 3.5 x 2 x 2.9 cm = volume: 10.9 mL. There is a 2.3 cm simple appearing cyst. Pulsed Doppler evaluation of both ovaries demonstrates normal low-resistance arterial and venous waveforms. Other findings No abnormal free fluid. IMPRESSION: No acute sonographic abnormality detected. No findings to explain the patient's pelvic pain. Electronically Signed   By: Katherine Mantlehristopher  Green M.D.   On: 11/07/2018 18:30   8:28 PM CT shows no acute abnormality.  There is again ovarian cyst noted of the left ovary.  Patient remains comfortable, no acute distress with  reassuring vital signs.  She is hemodynamically stable.  Explained findings to patient and advised her to follow-up with PCP and OB/GYN for further evaluation.  Patient agreeable to plan.  Vies to continue  home medications and return for worsening symptoms.   Patient is hemodynamically stable, in NAD, and able to ambulate in the ED. Evaluation does not show pathology that would require ongoing emergent intervention or inpatient treatment. I explained the diagnosis to the patient. Pain has been managed and has no complaints prior to discharge. Patient is comfortable with above plan and is stable for discharge at this time. All questions were answered prior to disposition. Strict return precautions for returning to the ED were discussed. Encouraged follow up with PCP.   An After Visit Summary was printed and given to the patient.   Portions of this note were generated with [employer]. Dictation errors may occur despite best attempts at proofreading.    Delia Heady, PA-C 11/07/18 2028    Lucrezia Starch, MD 11/08/18 [phone]

## 2020-02-28 ENCOUNTER — Encounter: Payer: Self-pay | Admitting: *Deleted

## 2020-02-28 ENCOUNTER — Telehealth: Payer: Self-pay | Admitting: Family Medicine

## 2020-02-28 NOTE — Telephone Encounter (Signed)
Pt is calling in stating that she is having abdominal pains and some spotting. She has appt for NEW OB 03-06-20 but needs to know if she needs to come in sooner or go to ER. Please call Pt ASAP. Call [phone]

## 2020-02-29 NOTE — Telephone Encounter (Signed)
Called patient with assistance of [employer] [place], [place] # [phone].   Patient reports she is having abdominal pain and spotting. She is having the abdominal pain every day and has had the very milk spotting twice. She reports she has not had sexual intercourse prior to spotting.   She reports she pain in her abdomen is in the middle of her abdomen and goes in the pelvis.   Patient reports she is very constipated. She reports she is is not drinking well Advised patient to drink at least 64 ounces of water daily. She is eating more vegetables. She reports she has not had difficulty with constipation in the past. She is taking prenatal vitamins.   She reports she had had some thick vaginal discharge and took Monistat & and reports discharge is better.   Reviewed spotting is not concerning at this time due to early pregnancy status.   Patient reports she is in pain with walking and is laying down a lot. Reviewed mild to moderate exercise is recommended.    Reviewed Tylenol 2 regular strength every 4 hours or 2 extra strength every 6 hours for the pain if needed.   Reviewed Colace dosage and instructions to add Metamucil, Fibercon or Citrucel with plenty of water.   Reviewed if bleeding like a period or having severe abdominal pain to go to the MAU at the [facility] for evaluation.   Patient reports she has no further questions or concerns.

## 2020-03-06 ENCOUNTER — Encounter: Payer: Self-pay | Admitting: Obstetrics & Gynecology

## 2020-03-06 ENCOUNTER — Other Ambulatory Visit: Payer: Self-pay

## 2020-03-06 ENCOUNTER — Ambulatory Visit (INDEPENDENT_AMBULATORY_CARE_PROVIDER_SITE_OTHER): Payer: Self-pay | Admitting: Obstetrics & Gynecology

## 2020-03-06 VITALS — BP 129/77 | HR 85 | Wt 121.5 lb

## 2020-03-06 DIAGNOSIS — O209 Hemorrhage in early pregnancy, unspecified: Secondary | ICD-10-CM

## 2020-03-06 DIAGNOSIS — O0990 Supervision of high risk pregnancy, unspecified, unspecified trimester: Secondary | ICD-10-CM

## 2020-03-06 NOTE — Progress Notes (Signed)
   Subjective:    Caroline Garrison is a [id number] [redacted]w[redacted]d being seen today for her first obstetrical visit.  Her obstetrical history is significant for advanced maternal age. Patient does intend to breast feed. Pregnancy history fully reviewed.  Patient reports bleeding and cramping over past week, Bleeding less than menses. Denies recent intercourse since positive pregnancy test result. .  Vitals:   03/06/20 0915  BP: 129/77  Pulse: 85  Weight: 121 lb 8 oz (55.1 kg)    HISTORY: OB History  Gravida Para Term Preterm AB Living  3 2 2  0 0 2  SAB TAB Ectopic Multiple Live Births  0 0 0 0 1    # Outcome Date GA Lbr Len/2nd Weight Sex Delivery Anes PTL Lv  3 Current           2 Term 11/12/12 [redacted]w[redacted]d 22:31 / 00:44 6 lb 14 oz (3.119 kg) M Vag-Spont EPI  LIV  1 Term            Past Medical History:  Diagnosis Date  . Headache   . Pregnancy induced hypertension    Past Surgical History:  Procedure Laterality Date  . MOUTH SURGERY    . NO PAST SURGERIES     Family History  Problem Relation Age of Onset  . Migraines Sister   . Cancer Neg Hx   . Heart disease Neg Hx   . Hypertension Neg Hx      Exam     Skin: normal coloration and turgor, no rashes    Neurologic: normal   Extremities: normal strength, tone, and muscle mass   Cardiovascular: regular rate and rhythm   Respiratory:  appears well, vitals normal, no respiratory distress, acyanotic, normal RR, ear and throat exam is normal, neck free of mass or lymphadenopathy, chest clear, no wheezing, crepitations, rhonchi, normal symmetric air entry   Abdomen: soft, non-tender; bowel sounds normal; no masses,  no organomegaly      Assessment:    Pregnancy: [redacted]w[redacted]d Patient Active Problem List   Diagnosis Date Noted  . Supervision of high risk pregnancy, antepartum 03/06/2020  . Tension headache, chronic 04/05/2016      Plan:     Prenatal vitamins. Problem list reviewed and updated. Early pregnancy bleeding,  Gestational sac, yolk sac noted on Abd U/S.  Quant, Progesterone ordered, recommend 48hr quant and 1 week confirmation of pregnancy U/S.   Follow up in 1 weeks. 80% of 15 min visit spent on counseling and coordination of care.  F/u 48hr quant, 1 week U/S.  SAB, bleeding precautions given.    06/03/2016 03/06/2020

## 2020-03-06 NOTE — Addendum Note (Signed)
Addended by: Raynelle Dick on: 03/06/2020 10:17 AM   Modules accepted: Orders

## 2020-03-06 NOTE — Addendum Note (Signed)
Addended by: Raynelle Dick on: 03/06/2020 10:09 AM   Modules accepted: Orders

## 2020-03-07 LAB — CBC/D/PLT+RPR+RH+ABO+RUB AB...
Antibody Screen: NEGATIVE
Basophils Absolute: 0 10*3/uL (ref 0.0–0.2)
Basos: 0 %
EOS (ABSOLUTE): 0 10*3/uL (ref 0.0–0.4)
Eos: 1 %
HCV Ab: <0.1 s/co ratio (ref 0.0–0.9)
HIV Screen 4th Generation wRfx: NONREACTIVE
Hematocrit: 38.4 % (ref 34.0–46.6)
Hemoglobin: 13.3 g/dL (ref 11.1–15.9)
Hepatitis B Surface Ag: NEGATIVE
Immature Grans (Abs): 0 10*3/uL (ref 0.0–0.1)
Immature Granulocytes: 0 %
Lymphocytes Absolute: 1.2 10*3/uL (ref 0.7–3.1)
Lymphs: 25 %
MCH: 31 pg (ref 26.6–33.0)
MCHC: 34.6 g/dL (ref 31.5–35.7)
MCV: 90 fL (ref 79–97)
Monocytes Absolute: 0.4 10*3/uL (ref 0.1–0.9)
Monocytes: 8 %
Neutrophils Absolute: 3.1 10*3/uL (ref 1.4–7.0)
Neutrophils: 66 %
Platelets: 300 10*3/uL (ref 150–450)
RBC: 4.29 x10E6/uL (ref 3.77–5.28)
RDW: 11.8 % (ref 11.7–15.4)
RPR Ser Ql: NONREACTIVE
Rh Factor: POSITIVE
Rubella Antibodies, IGG: 19.6 index (ref >0.99)
WBC: 4.7 10*3/uL (ref 3.4–10.8)

## 2020-03-07 LAB — HEMOGLOBIN A1C
Est. average glucose Bld gHb Est-mCnc: 105 mg/dL
Hgb A1c MFr Bld: 5.3 % (ref 4.8–5.6)

## 2020-03-07 LAB — HCV INTERPRETATION

## 2020-03-08 ENCOUNTER — Other Ambulatory Visit: Payer: Self-pay

## 2020-03-08 ENCOUNTER — Other Ambulatory Visit: Payer: Self-pay | Admitting: Lactation Services

## 2020-03-08 ENCOUNTER — Other Ambulatory Visit: Payer: No Typology Code available for payment source

## 2020-03-08 DIAGNOSIS — O209 Hemorrhage in early pregnancy, unspecified: Secondary | ICD-10-CM

## 2020-03-09 LAB — BETA HCG QUANT (REF LAB): hCG Quant: 51687 m[IU]/mL

## 2020-03-14 ENCOUNTER — Other Ambulatory Visit: Payer: Self-pay

## 2020-03-14 ENCOUNTER — Encounter: Payer: Self-pay | Admitting: Family Medicine

## 2020-03-14 ENCOUNTER — Ambulatory Visit
Admission: RE | Admit: 2020-03-14 | Discharge: 2020-03-14 | Disposition: A | Discharge status: Home / Self Care | Payer: No Typology Code available for payment source | Source: Ambulatory Visit | Attending: Obstetrics & Gynecology | Admitting: Obstetrics & Gynecology

## 2020-03-14 ENCOUNTER — Other Ambulatory Visit: Payer: Self-pay | Admitting: Obstetrics & Gynecology

## 2020-03-14 ENCOUNTER — Ambulatory Visit (INDEPENDENT_AMBULATORY_CARE_PROVIDER_SITE_OTHER): Payer: Self-pay | Admitting: Family Medicine

## 2020-03-14 DIAGNOSIS — O209 Hemorrhage in early pregnancy, unspecified: Secondary | ICD-10-CM

## 2020-03-14 DIAGNOSIS — O0991 Supervision of high risk pregnancy, unspecified, first trimester: Secondary | ICD-10-CM

## 2020-03-14 DIAGNOSIS — O0990 Supervision of high risk pregnancy, unspecified, unspecified trimester: Secondary | ICD-10-CM

## 2020-03-14 NOTE — Progress Notes (Signed)
Subjective:  Caroline Garrison is a 39 y.o. G3P2002 at [redacted]w[redacted]d being seen today for ongoing prenatal care.  She is currently monitored for the following issues for this high-risk pregnancy and has Tension headache, chronic and Supervision of high risk pregnancy, antepartum on their problem list.  Patient here for results of viability [place]. Seen for initial prenatal on 03/06/2020, at that time on BSUS Dr. Mayford Knife noted gestational sac and yolk sac Ordered for 48h hcg and 1 week [place] At that time reporting some bleeding and cramping  Today reports infrequent cramping but ongoing bleeding She is very nervous about the status of her pregnancy Last LMP was at the end of September, reports bleeding has been happening since the beginning of November  The following portions of the patient's history were reviewed and updated as appropriate: allergies, current medications, past family history, past medical history, past social history, past surgical history and problem list. Problem list updated.  Objective:  There were no vitals filed for this visit.  Fetal Status:           General:  Alert, oriented and cooperative. Patient is in no acute distress.  Skin: Skin is warm and dry. No rash noted.   Cardiovascular: Normal heart rate noted  Respiratory: Normal respiratory effort, no problems with respiration noted  Abdomen: Soft, gravid, appropriate for gestational age.       Pelvic:       Cervical exam deferred        Extremities: Normal range of motion.     Mental Status: Normal mood and affect. Normal behavior. Normal judgment and thought content.   Urinalysis:      US OB LESS THAN 14 WEEKS WITH OB TRANSVAGINAL CLINICAL DATA:  Bleeding in early pregnancy, LMP 12/21/2019  EXAM: OBSTETRIC <14 WK [place] AND TRANSVAGINAL OB US  TECHNIQUE: Both transabdominal and transvaginal ultrasound examinations were performed for complete evaluation of the gestation as well as the maternal uterus, adnexal  regions, and pelvic cul-de-sac. Transvaginal technique was performed to assess early pregnancy.  COMPARISON:  None  FINDINGS: Intrauterine gestational sac: Present, single  Yolk sac:  Not definitely identified, see below  Embryo:  Questionably visualized, see below  Cardiac Activity: N/A  Heart Rate: N/A  bpm  MSD: 10.7 mm   5 w   6 d  Subchorionic hemorrhage:  None visualized.  Maternal uterus/adnexae:  Thin membrane is seen within the gestational sac, nonspecific, cannot exclude abnormal enlarged yolk sac.  Two nodular foci are seen within the gestational sac 2.5 mm and 3.1 mm in sizes which are indeterminate.  Uterus otherwise unremarkable.  RIGHT ovary normal size and morphology, 2.1 x 2.6 x 2.2 cm.  LEFT ovary normal size and morphology, 2.8 x 1.5 x 1.3 cm.  No free pelvic fluid or adnexal masses.  IMPRESSION: Gestational sac seen within the uterus, mean sac diameter corresponding to 5 weeks 6 days EGA.  Questionable abnormal enlarged yolk sac versus nonspecific curvilinear membrane within gestational sac.  Two additional tiny nodular foci are identified within the gestational sac, indeterminate, questionable tiny fetal pole versus debris.  Consider follow-up ultrasound in 14 weeks to reassess viability.  Electronically Signed   By: Ulyses Southward M.D.   On: 03/14/2020 16:25   Assessment and Plan:  Pregnancy: G3P2002 at [redacted]w[redacted]d  1. Bleeding in early pregnancy  - Beta hCG quant (ref lab)  2. Supervision of high risk pregnancy, antepartum Patient's US showing IUP, with GS measuring 89mm and without definitive visualization  of yolk sac or embryo, GA at 5 weeks well behind LMP of 12 weeks Beta last week was 51k, unfortunately a delta value was not obtained Given abdominal US last week by Dr. Mayford Knife and TVUS today showing no progress on reported findings from last week, I discussed with patient that her presentation is likely c/w SAB That being said, not  totally clear to me if we have a new pregnancy compared (with first one being from LMP in September) or a SAB in progress Ordered for beta but no phlebotomist in clinic, she will return tomorrow morning for beta to determine dispo   Preterm labor symptoms and general obstetric precautions including but not limited to vaginal bleeding, contractions, leaking of fluid and fetal movement were reviewed in detail with the patient. Please refer to After Visit Summary for other counseling recommendations.  Return in 4 weeks (on 04/11/2020).   Venora Maples, MD

## 2020-03-14 NOTE — Progress Notes (Signed)
Spanish Interpreter Lesly Rubenstein.

## 2020-03-15 ENCOUNTER — Other Ambulatory Visit: Payer: Self-pay

## 2020-03-16 ENCOUNTER — Telehealth: Payer: Self-pay | Admitting: Family Medicine

## 2020-03-16 LAB — BETA HCG QUANT (REF LAB): hCG Quant: 31583 m[IU]/mL

## 2020-03-16 NOTE — Telephone Encounter (Signed)
Called patient to discuss results of repeat beta HCG testing.  Given provider bedside US last week showing GS and YS, and TVUS this week showing neither, along with HCG decline from 51k to 31k, informed patient this is consistent with failed pregnancy.   Patient aware this was possible and upset. She thinks she probably passed the pregnancy last night, as she had intense cramps and passed some large clots, currently very light cramping and just some spotting.  Discussed continued expectant management vs misorprostol vs evaluation for D&C. She elects for continued expectant management, I instructed her to call the clinic if she continues to have symptoms in one week for trial of misoprostol. Also reviewed warning signs of fever, heavy bleeding, crescendo abdominal pain, to go to ED if has any of the above.

## 2020-03-20 ENCOUNTER — Telehealth: Payer: Self-pay | Admitting: Family Medicine

## 2020-03-20 NOTE — Telephone Encounter (Signed)
Patient is having a lot of ABD pain / no bleeding, Provider said they can call her in a RX.   Caroline Garrison on [employer]

## 2020-03-27 ENCOUNTER — Telehealth: Payer: Self-pay | Admitting: Family Medicine

## 2020-03-27 NOTE — Telephone Encounter (Signed)
Pt spouse calling for- Spanish -. States that she never received a call from the dr and no Rx and she is still having abd pain, faint, dizzy, weak. Pt states she would like a call back ASAP to speak with someone about this. Pt spouse speaks [place] and he is with her. thanks

## 2020-03-28 ENCOUNTER — Other Ambulatory Visit: Payer: Self-pay | Admitting: Lactation Services

## 2020-03-28 ENCOUNTER — Telehealth: Payer: Self-pay | Admitting: Family Medicine

## 2020-03-28 DIAGNOSIS — O209 Hemorrhage in early pregnancy, unspecified: Secondary | ICD-10-CM

## 2020-03-28 MED ORDER — IBU 800 MG PO TABS: 800.0000 mg | ORAL_TABLET | Freq: Three times a day (TID) | ORAL | 0 refills | Status: AC

## 2020-03-28 NOTE — Telephone Encounter (Signed)
Spanish -. States that she never received a call from the dr and no Rx and she is still having abd pain, faint, dizzy, weak. Pt states she would like a call back ASAP to speak with someone about this.. thanks

## 2020-03-28 NOTE — Telephone Encounter (Signed)
Addressed in another encounter

## 2020-03-28 NOTE — Addendum Note (Signed)
Addended by: Ed Blalock on: 03/28/2020 11:11 AM   Modules accepted: Orders

## 2020-03-28 NOTE — Telephone Encounter (Addendum)
Spoke with Dr. Crissie Reese, he would like to see patient in the office this week. He originally ordered repeat US, decided to wait as patient is [facility]opt [facility] Mom Patient, due to cost.   Appointment scheduled in the office for patient on 12/30 at 8:10 am.   Called patient with assistance of [employer], [phone].   Patient reports she had a loss almost 2 weeks ago. She reports she is having pain daily and a headache. When she walks she gets dizzy, not every time but a lot of the time.   She reports the headache has been present since the loss of the baby. She has taken Ibuprofen and reports the headache does not go away.  She is taking Ibuprofen 800 mg every 8 hours, she is skipping some doses as she feels she is not seeing improvement.   She reports the abdominal pain is below her belly button, She is feeling cramps and sharp pain. It sometimes wakes her up. She reports the Ibuprofen sometimes helps minimally with the pain briefly.   She has not tried Tylenol in the past but reports it did not work. Reviewed trying Tylenol every 8 hours 1000 mg and then Ibuprofen 800 mg every 8 hours. She is requesting a new prescription of Ibuprofen.   She reports no bleeding, she is having a small amount of discharge that is yellowish.   Patient is requesting an [place] and discussed with her that she would have a high cost, reviewed that Dr. Crissie Reese will see her on Thursday morning and then will decide on follow up US, patient voiced understanding.   Patient reports she has been drinking lots of water and liquids. She is not feeling like eating, advised her to eat a small meal or snack every few hours.   Patient asked where to go for care if she starts feeling worse. Reviewed going to [facility] only for emergency reasons if needed. Patient voiced understanding.

## 2020-03-28 NOTE — Progress Notes (Signed)
Called Radiology to schedule [place], Dr. Crissie Reese then asked to wait due to cost as patient is an [facility]opt [facility] Mom patient.

## 2020-03-30 ENCOUNTER — Encounter: Payer: Self-pay | Admitting: Family Medicine

## 2020-03-30 ENCOUNTER — Other Ambulatory Visit: Payer: Self-pay

## 2020-03-30 ENCOUNTER — Other Ambulatory Visit (HOSPITAL_COMMUNITY)
Admission: RE | Admit: 2020-03-30 | Discharge: 2020-03-30 | Disposition: A | Discharge status: Home / Self Care | Payer: No Typology Code available for payment source | Source: Ambulatory Visit | Attending: Family Medicine | Admitting: Family Medicine

## 2020-03-30 ENCOUNTER — Ambulatory Visit (INDEPENDENT_AMBULATORY_CARE_PROVIDER_SITE_OTHER): Payer: Self-pay | Admitting: Family Medicine

## 2020-03-30 VITALS — BP 125/84 | HR 71 | Temp 98.3°F | Wt 119.6 lb

## 2020-03-30 DIAGNOSIS — O039 Complete or unspecified spontaneous abortion without complication: Secondary | ICD-10-CM | POA: Insufficient documentation

## 2020-03-30 MED ORDER — MISOPROSTOL 200 MCG PO TABS: 800.0000 ug | ORAL_TABLET | Freq: Once | ORAL | 0 refills | Status: AC

## 2020-03-30 MED ORDER — OXYCODONE-ACETAMINOPHEN 5-325 MG PO TABS: 1.0000 | ORAL_TABLET | ORAL | 0 refills | Status: AC | PRN

## 2020-03-30 NOTE — Progress Notes (Signed)
GYNECOLOGY OFFICE VISIT NOTE  History:   Caroline Garrison is a 39 y.o. [id number] here today for ongoing pain after a SAB.  Last seen on 03/14/2020, at that time had [place] suspicious for failed pregnancy Subsequently had quant with marked decrease in HCG c/w failed pregnancy We spoke later that week and she reported feeling that she had likely passed the pregnancy  She presents today reporting she had about a week of bleeding since last being seen, but none for the past week Continues to have lower abdominal pain Pain is constant Some vaginal discharge, yellowish in color No burning or pain with urination Some mild constipation No fever, no nausea, no vomiting Has tried ibuprofen, it helps for a little but then the pain comes back Has not had sex since before she was told she was pregnant   Past Medical History:  Diagnosis Date  . Headache   . Pregnancy induced hypertension     Past Surgical History:  Procedure Laterality Date  . MOUTH SURGERY    . NO PAST SURGERIES      The following portions of the patient's history were reviewed and updated as appropriate: allergies, current medications, past family history, past medical history, past social history, past surgical history and problem list.   Health Maintenance:  Normal pap and negative HRHPV: none on file.  Normal mammogram: n/a.   Review of Systems:  Pertinent items noted in HPI and remainder of comprehensive ROS otherwise negative.  Physical Exam:  BP 125/84   Pulse 71   Temp 98.3 F (36.8 C)   Wt 119 lb 9.6 oz (54.3 kg)   LMP 12/21/2019   BMI 23.36 kg/m  CONSTITUTIONAL: Well-developed, well-nourished female in no acute distress.  HEENT:  Normocephalic, atraumatic. External right and left ear normal. No scleral icterus.  NECK: Normal range of motion, supple, no masses noted on observation SKIN: No rash noted. Not diaphoretic. No erythema. No pallor. MUSCULOSKELETAL: Normal range of motion. No edema  noted. NEUROLOGIC: Alert and oriented to person, place, and time. Normal muscle tone coordination.  PSYCHIATRIC: Normal mood and affect. Normal behavior. Normal judgment and thought content. RESPIRATORY: Effort normal, no problems with respiration noted ABDOMEN: No masses noted. No other overt distention noted.   PELVIC: Deferred  Labs and Imaging No results found for this or any previous visit (from the past 168 hour(s)). US OB LESS THAN 14 WEEKS WITH OB TRANSVAGINAL  Result Date: 03/14/2020 CLINICAL DATA:  Bleeding in early pregnancy, LMP 12/21/2019 EXAM: OBSTETRIC <14 WK [place] AND TRANSVAGINAL OB US TECHNIQUE: Both transabdominal and transvaginal ultrasound examinations were performed for complete evaluation of the gestation as well as the maternal uterus, adnexal regions, and pelvic cul-de-sac. Transvaginal technique was performed to assess early pregnancy. COMPARISON:  None FINDINGS: Intrauterine gestational sac: Present, single Yolk sac:  Not definitely identified, see below Embryo:  Questionably visualized, see below Cardiac Activity: N/A Heart Rate: N/A  bpm MSD: 10.7 mm   5 w   6 d Subchorionic hemorrhage:  None visualized. Maternal uterus/adnexae: Thin membrane is seen within the gestational sac, nonspecific, cannot exclude abnormal enlarged yolk sac. Two nodular foci are seen within the gestational sac 2.5 mm and 3.1 mm in sizes which are indeterminate. Uterus otherwise unremarkable. RIGHT ovary normal size and morphology, 2.1 x 2.6 x 2.2 cm. LEFT ovary normal size and morphology, 2.8 x 1.5 x 1.3 cm. No free pelvic fluid or adnexal masses. IMPRESSION: Gestational sac seen within the uterus, mean sac diameter corresponding  to 5 weeks 6 days EGA. Questionable abnormal enlarged yolk sac versus nonspecific curvilinear membrane within gestational sac. Two additional tiny nodular foci are identified within the gestational sac, indeterminate, questionable tiny fetal pole versus debris. Consider  follow-up ultrasound in 14 weeks to reassess viability. Electronically Signed   By: Ulyses Southward M.D.   On: 03/14/2020 16:25      Assessment and Plan:   Problem List Items Addressed This Visit      Other   SAB (spontaneous abortion) - Primary    Patient with ongoing lower pelvic pain in setting of SAB. Bedside US shows some poorly visualized debris in LUS, though majority of endometrial strip is unremarkable. Pain may be secondary to some retained products, luckily no sign of infection but will trial a dose of misoprostol. May also be an element of uterine inversion. Will also check swabs for infection given vaginal discharge. Given warning signs and anticipatory guidance on taking misoprostol, if no improvement in 1 week she will call us back and we can obtain repeat TVUS at that time if necessary.       Relevant Medications   misoprostol (CYTOTEC) 200 MCG tablet   oxyCODONE-acetaminophen (PERCOCET/ROXICET) 5-325 MG tablet   Other Relevant Orders   Cervicovaginal ancillary only( [place])      Routine preventative health maintenance measures emphasized. Please refer to After Visit Summary for other counseling recommendations.   Return if symptoms worsen or fail to improve.    Total face-to-face time with patient: 15 minutes.  Over 50% of encounter was spent on counseling and coordination of care.   Venora Maples, MD/[facility] [employer], [facility]

## 2020-03-30 NOTE — Assessment & Plan Note (Signed)
Patient with ongoing lower pelvic pain in setting of SAB. Bedside US shows some poorly visualized debris in LUS, though majority of endometrial strip is unremarkable. Pain may be secondary to some retained products, luckily no sign of infection but will trial a dose of misoprostol. May also be an element of uterine inversion. Will also check swabs for infection given vaginal discharge. Given warning signs and anticipatory guidance on taking misoprostol, if no improvement in 1 week she will call us back and we can obtain repeat TVUS at that time if necessary.

## 2020-03-30 NOTE — Patient Instructions (Signed)
Misoprostol tablets Qu es este medicamento? El MISOPROSTOL ayuda a prevenir Tour manager en pacientes que utilizan medicamentos, como ibuprofeno y aspirina y que corren alto riesgo de sufrir complicaciones de lceras. Este medicamento puede ser utilizado para otros usos; si tiene alguna pregunta consulte con su proveedor de atencin mdica o con su farmacutico. MARCAS COMUNES: Cytotec Qu le debo informar a mi profesional de la salud antes de tomar este medicamento? Necesita saber si usted presenta alguno de los [employer] o situaciones:  enfermedad de Crohn  enfermedad cardiaca  enfermedad renal  colitis ulcerosa  una reaccin alrgica o inusual al misoprostol, las prostaglandinas, otros medicamentos, alimentos, colorantes o conservantes  si est embarazada o buscando quedar embarazada  si est amamantando a un beb Cmo debo [employer]? Tome este medicamento por va oral con un vaso lleno de agua. Siga las instrucciones de la etiqueta del [place]. Tome este medicamento con alimentos. Tome su medicamento a intervalos regulares. No tome su medicamento con una frecuencia mayor a la indicada. Hable con su pediatra para informarse acerca del uso de este medicamento en nios. Puede requerir atencin especial. Sobredosis: Pngase en contacto inmediatamente con un centro toxicolgico o una sala de urgencia si usted cree que haya tomado demasiado medicamento. ATENCIN: [employer] es solo para usted. No comparta este medicamento con nadie. Qu sucede si me olvido de una dosis? Si olvida una dosis, tmela lo antes posible. Si es casi la hora de la prxima dosis, tome slo esa dosis. No tome dosis adicionales o dobles. Qu puede interactuar con este medicamento?  anticidos Puede ser que esta lista no menciona todas las posibles interacciones. Informe a su profesional de [employer] de [employer] productos a base de hierbas, medicamentos de [place] o suplementos nutritivos que est tomando. Si usted fuma, consume bebidas alcohlicas o si utiliza drogas ilegales, indqueselo tambin a su profesional de [employer]. Algunas sustancias pueden interactuar con su medicamento. A qu debo estar atento al usar [employer]? No fume ni ingiera alcohol. Esto irrita an ms el estmago y puede hacerlo ms susceptible a dao por el uso de [place], tales como ibuprofeno y [place]. Si es [place], no debe Chemical engineer este medicamento si est embarazada. Debe evitar quedar embarazada mientras est tomando este medicamento y durante por lo menos un mes (un ciclo menstrual completo) despus de Research officer, trade union. Si puede quedar [place], utilice un mtodo anticonceptivo confiable mientras est tomando [employer]. Hable con su mdico sobre las opciones de mtodos anticonceptivos. Si queda embarazada o cree estarlo, o si desea quedar embarazada, comunquese con su mdico. Qu efectos secundarios puedo tener al [employer] este medicamento? Efectos secundarios que debe informar a su mdico o a Producer, television/film/video de la salud tan pronto como sea posible:  Therapist, art como erupcin cutnea, picazn o urticarias, hinchazn de la cara, labios o Tree surgeon en el pecho  desmayos  diarrea severa  falta repentina de aliento  sangrado vaginal inusual, dolor o calambres plvicos Efectos secundarios que, por lo general, no requieren atencin mdica (debe informarlos a su mdico o a su profesional de la salud si persisten o si son molestos):  Physiological scientist de cabeza  Regulatory affairs officer, calambres o irregularidad menstrual  diarrea leve  nuseas  malestar o calambres estomacales Puede ser que esta lista no menciona todos los posibles efectos secundarios. Comunquese a su mdico por asesoramiento mdico [employer]. Usted puede informar los efectos secundarios a la FDA por telfono  al 1-800-FDA-1088. Dnde debo guardar mi  medicina? Mantngala fuera del alcance de los nios. Gurdela a [employer], a menos de 25 grados C (77 grados F). Gurdela en un lugar seco. Protjala de la humedad. Deseche todo el medicamento que no haya utilizado, despus de la fecha de vencimiento. ATENCIN: Este folleto es un resumen. Puede ser que no cubra toda la posible informacin. Si usted tiene preguntas acerca de esta medicina, consulte con su mdico, su farmacutico o su profesional de Radiographer, therapeutic.  2020 Elsevier/Gold Standard (2016-04-18 00:00:00)

## 2020-04-03 LAB — CERVICOVAGINAL ANCILLARY ONLY
Bacterial Vaginitis (gardnerella): NEGATIVE
Candida Glabrata: NEGATIVE
Candida Vaginitis: NEGATIVE
Chlamydia: NEGATIVE
Comment: NEGATIVE
Comment: NEGATIVE
Comment: NEGATIVE
Comment: NEGATIVE
Comment: NEGATIVE
Comment: NORMAL
Neisseria Gonorrhea: NEGATIVE
Trichomonas: NEGATIVE

## 2020-04-04 ENCOUNTER — Ambulatory Visit: Payer: No Typology Code available for payment source

## 2020-04-04 ENCOUNTER — Inpatient Hospital Stay: Admission: RE | Admit: 2020-04-04 | Payer: No Typology Code available for payment source | Source: Ambulatory Visit

## 2021-05-05 IMAGING — US US PELVIS COMPLETE
1 series · 13 of 25 positions shown · non-contrast
Comparison: None.

CLINICAL DATA: Pelvic pain. Tenderness to palpation in the left
lower quadrant, right lower quadrant. Concern for possible
tubo-ovarian abscess.

EXAM:
TRANSABDOMINAL AND TRANSVAGINAL ULTRASOUND OF PELVIS
DOPPLER ULTRASOUND OF OVARIES
TECHNIQUE: Both transabdominal and transvaginal ultrasound examinations of the
pelvis were performed. Transabdominal technique was performed for
global imaging of the pelvis including uterus, ovaries, adnexal
regions, and pelvic cul-de-sac.
It was necessary to proceed with endovaginal exam following the
transabdominal exam to visualize the ovaries. Color and duplex
Doppler ultrasound was utilized to evaluate blood flow to the
ovaries.

[Series 1: us pelvis complete · 13 of 46 slices shown]
[im 1/46]
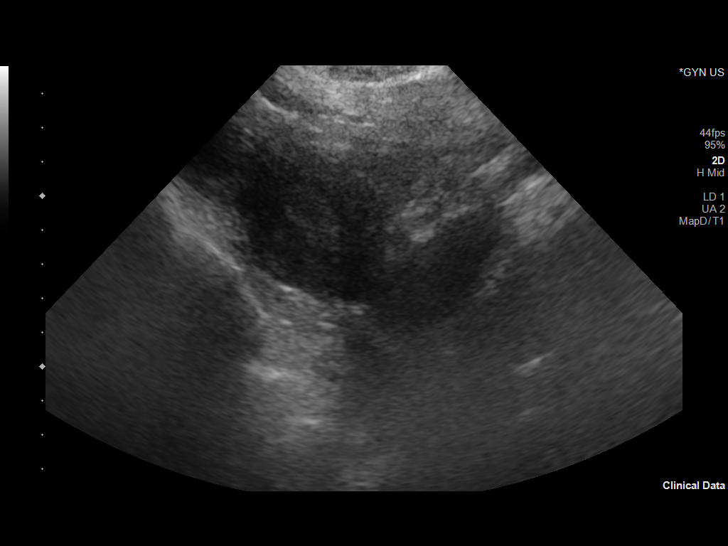
[im 4/46]
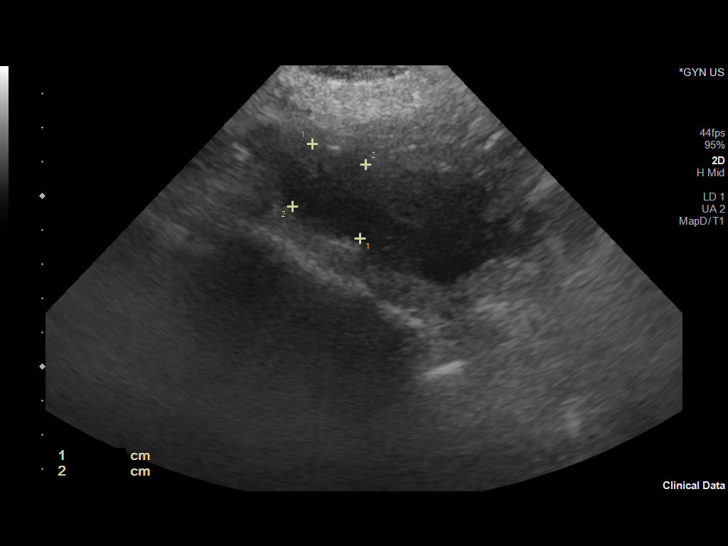
[im 8/46]
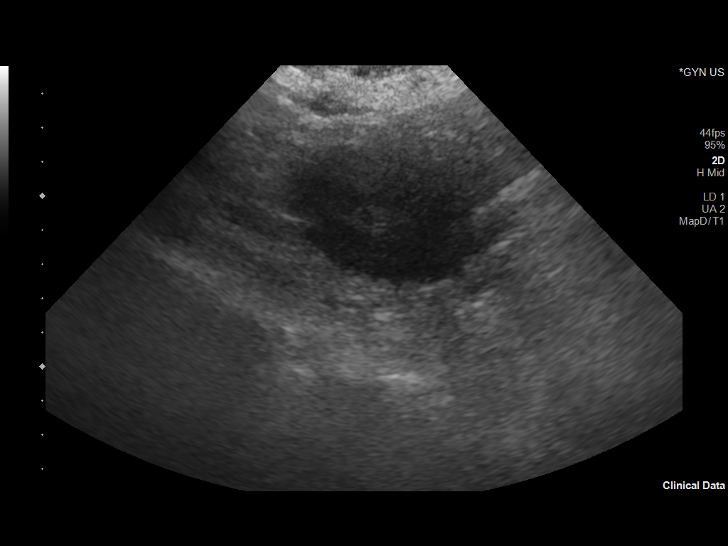
[im 12/46]
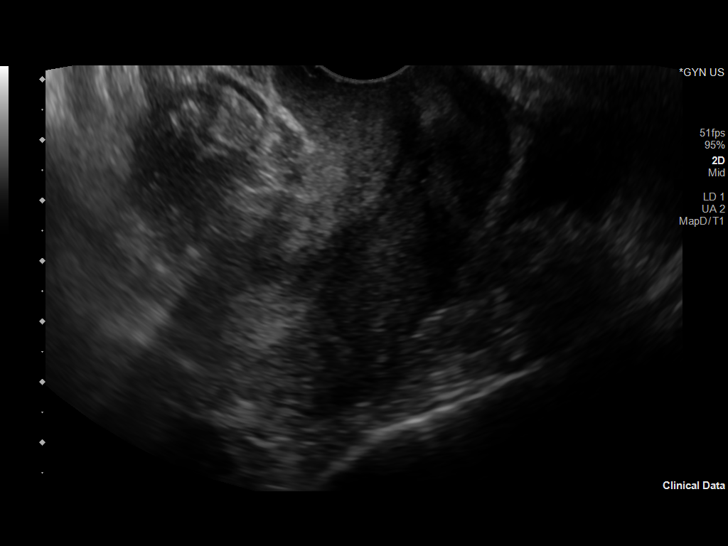
[im 16/46]
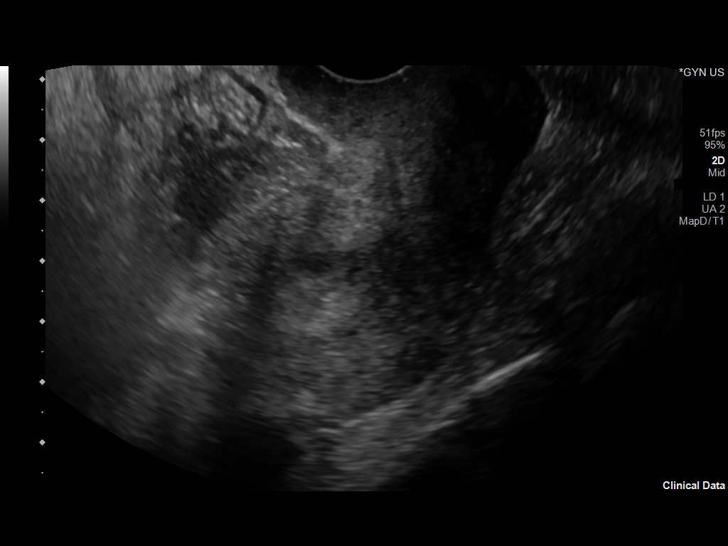
[im 19/46]
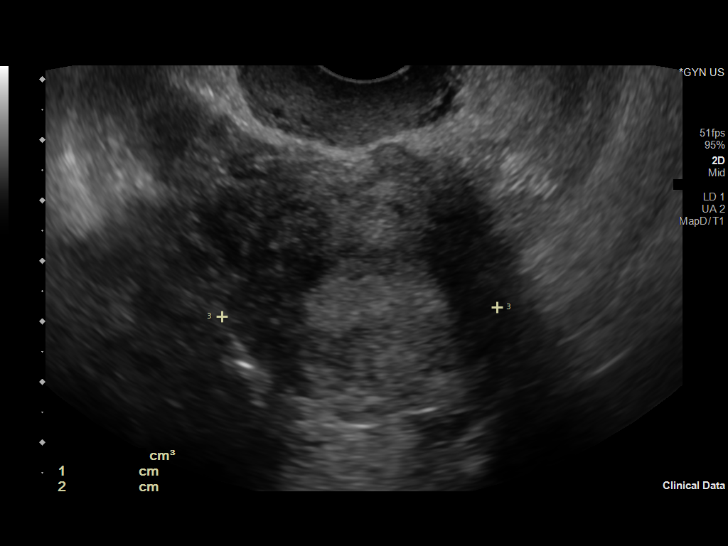
[im 23/46]
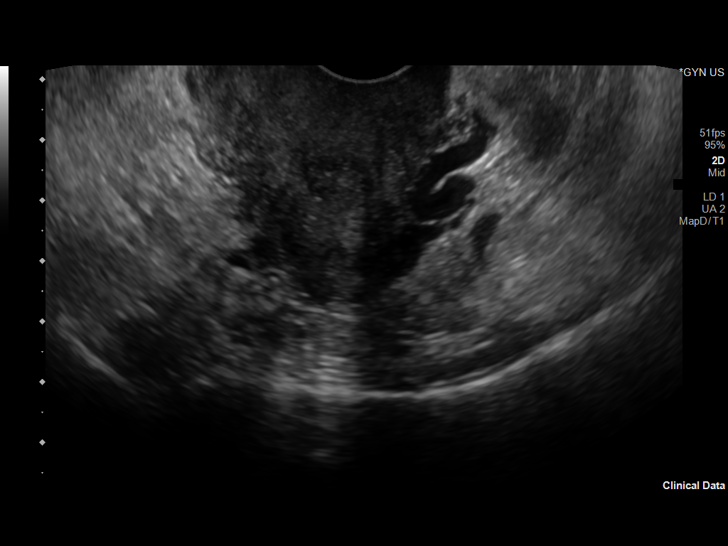
[im 27/46]
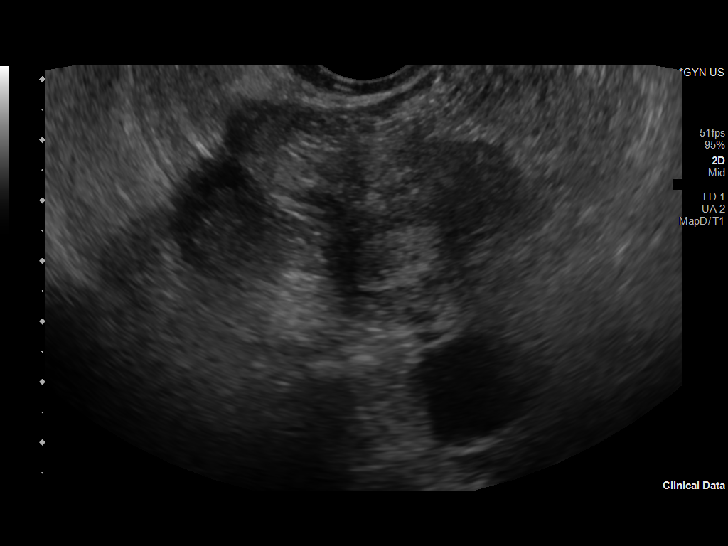
[im 31/46]
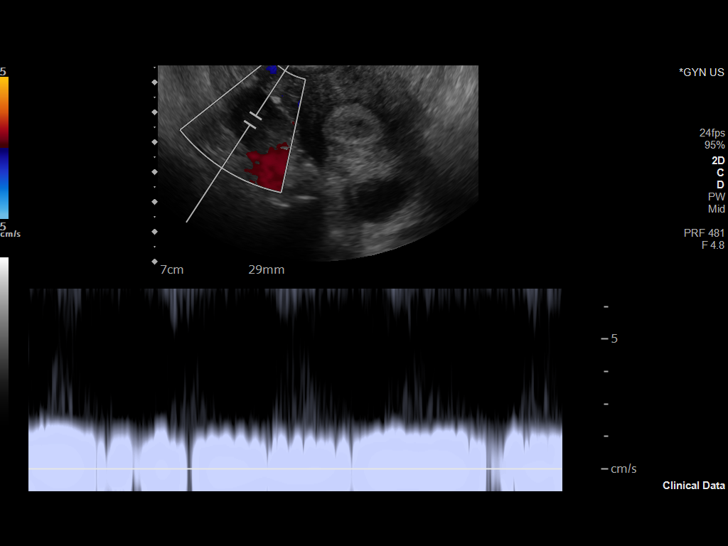
[im 34/46]
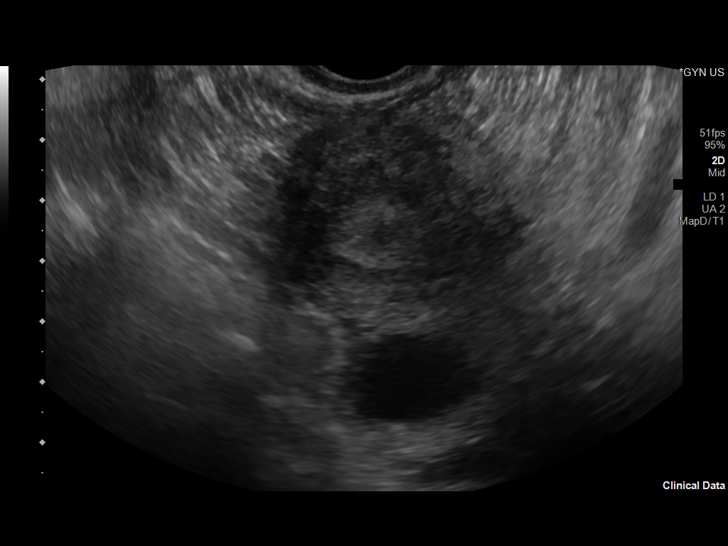
[im 38/46]
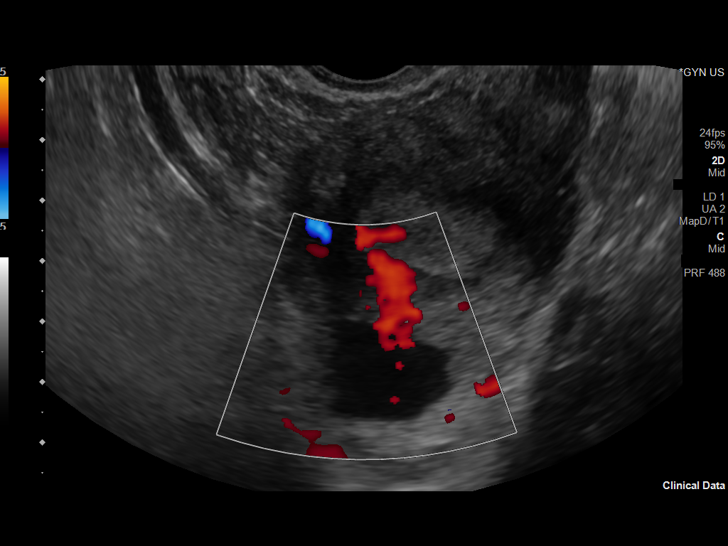
[im 42/46]
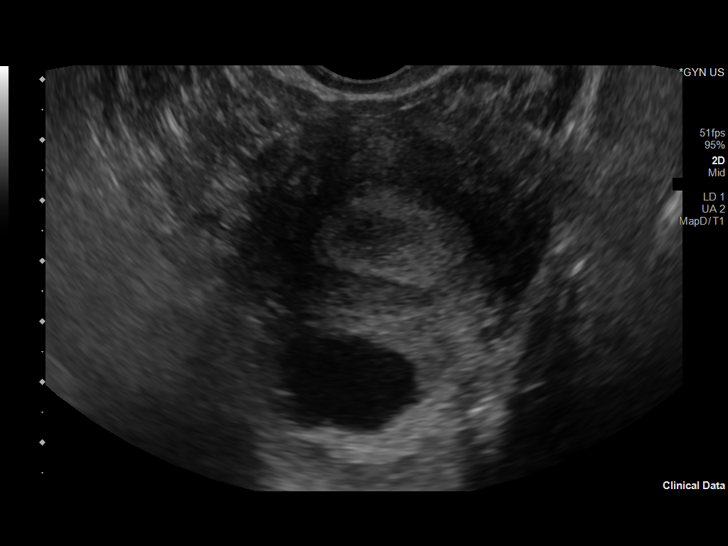
[im 46/46]
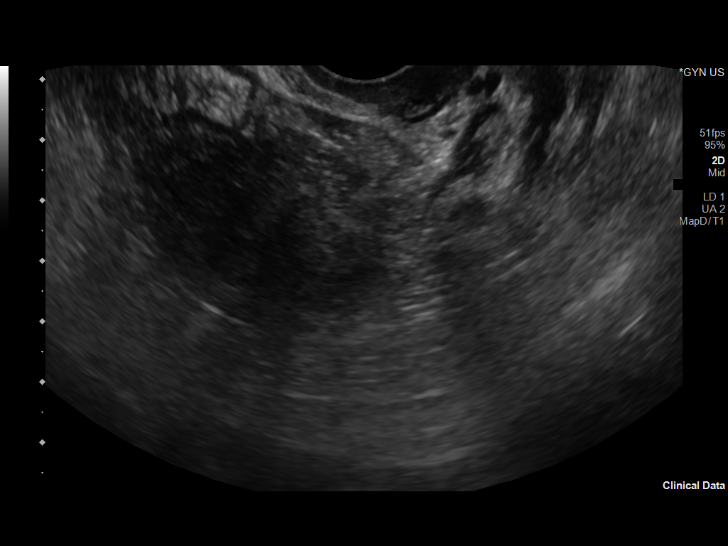

[13 of 25 positions shown; findings below may reference images not displayed]

FINDINGS: Uterus

Measurements: 7.3 x 3.7 x 4.5 cm = volume: 65 mL. No fibroids or
other mass visualized.

Endometrium

Thickness: 11 mm.  No focal abnormality visualized.

Right ovary

Measurements: 2.5 x 1.6 x 1.7 cm = volume: 3.5 mL. Normal
appearance/no adnexal mass.

Left ovary

Measurements: 3.5 x 2 x 2.9 cm = volume: 10.9 mL. There is a 2.3 cm
simple appearing cyst.

Pulsed Doppler evaluation of both ovaries demonstrates normal
low-resistance arterial and venous waveforms.

Other findings

No abnormal free fluid.
IMPRESSION: No acute sonographic abnormality detected. No findings to explain
the patient's pelvic pain.

## 2021-05-05 IMAGING — CT CT ABDOMEN AND PELVIS WITH CONTRAST
2 of 4 series · 17 of 46 positions shown, 19 images · IV contrast (Omni 300)
Comparison: None.

CLINICAL DATA: Abdominal pain for 1 week.

EXAM:
CT ABDOMEN AND PELVIS WITH CONTRAST
TECHNIQUE: Multidetector CT imaging of the abdomen and pelvis was performed
using the standard protocol following bolus administration of
intravenous contrast.
CONTRAST:  100mL OMNIPAQUE IOHEXOL 300 MG/ML  SOLN

[Series 3: a/p w/ 5mm · axial · 0.75mm/px · z∈[+639,+1024]mm · 14 of 85 slices shown, 16 images]
[im 4/85  soft-tissue]
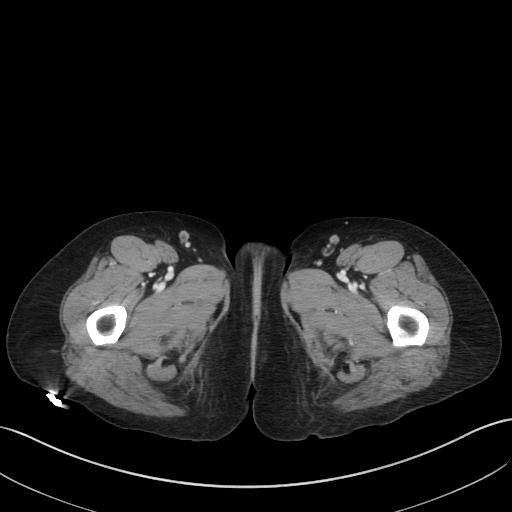
[im 4/85  bone]
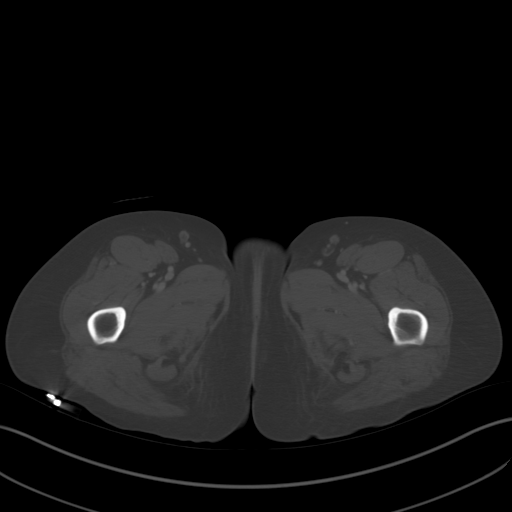
[im 11/85  soft-tissue]
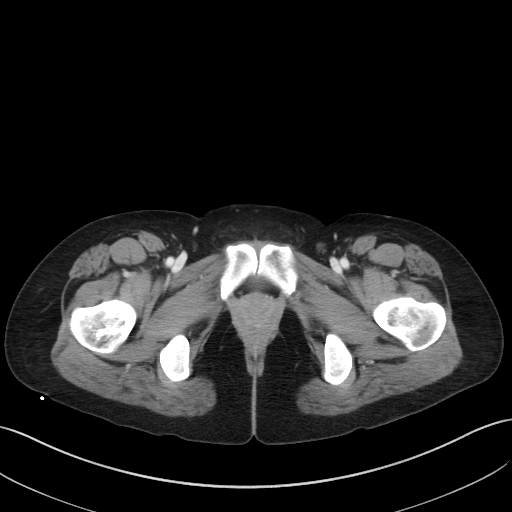
[im 17/85  soft-tissue]
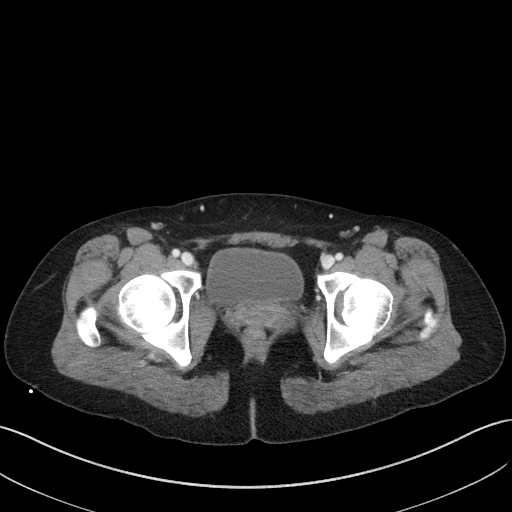
[im 24/85  soft-tissue]
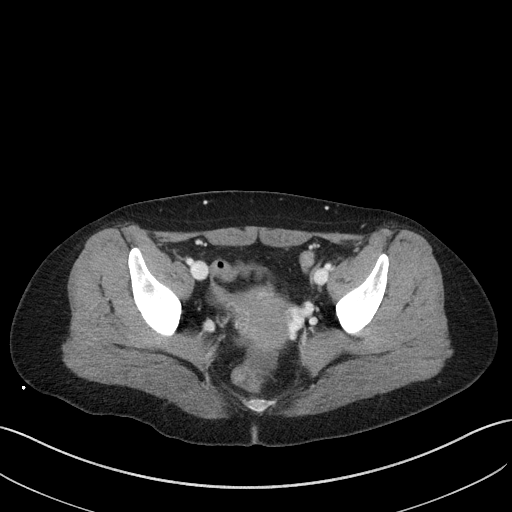
[im 27/85  soft-tissue]
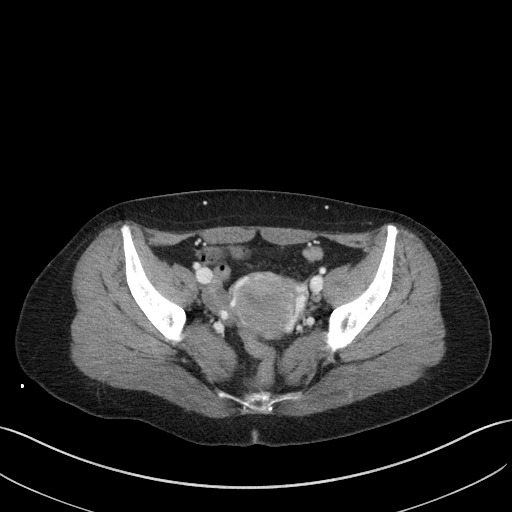
[im 34/85  soft-tissue]
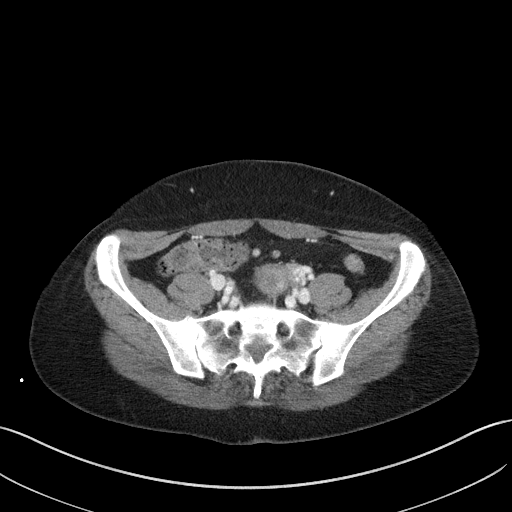
[im 41/85  soft-tissue]
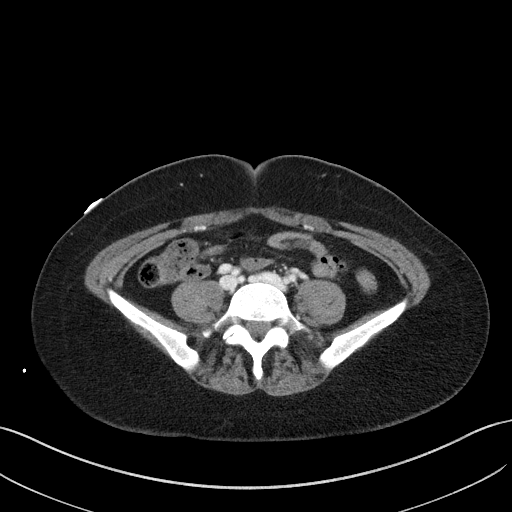
[im 44/85  soft-tissue]
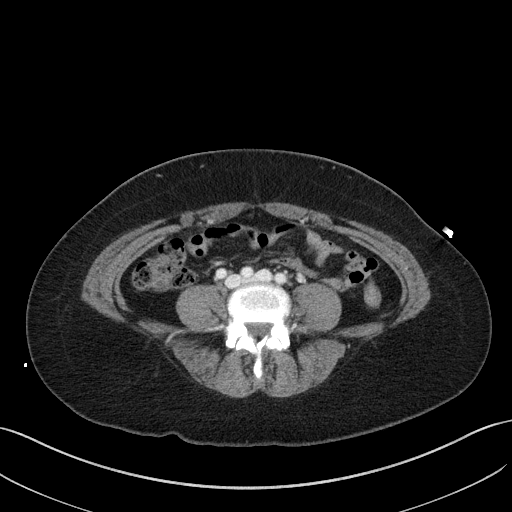
[im 51/85  soft-tissue]
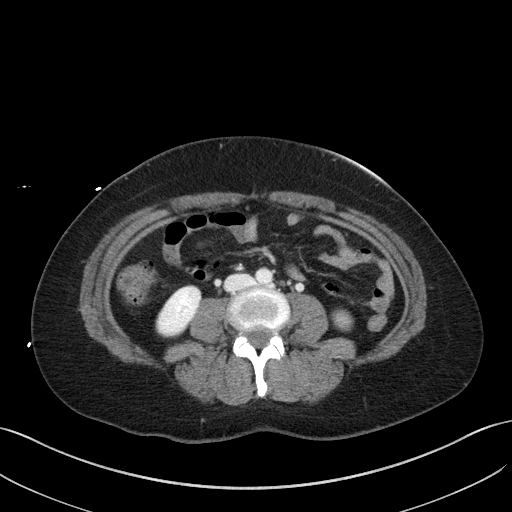
[im 51/85  bone]
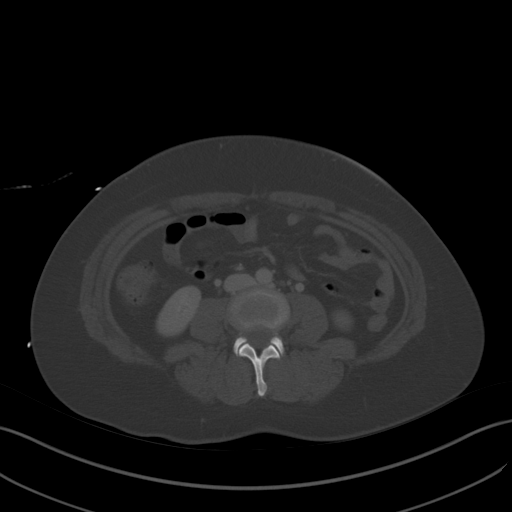
[im 58/85  soft-tissue]
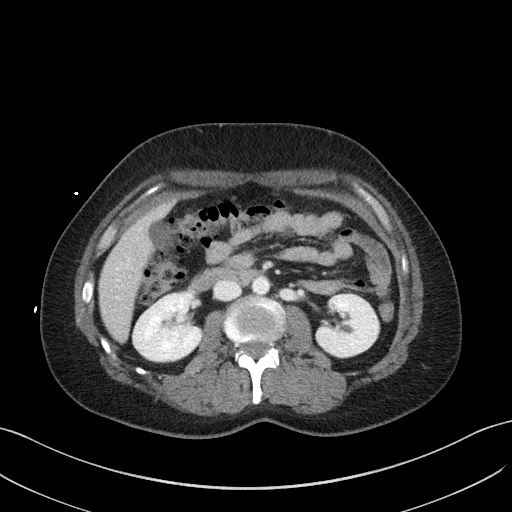
[im 64/85  soft-tissue]
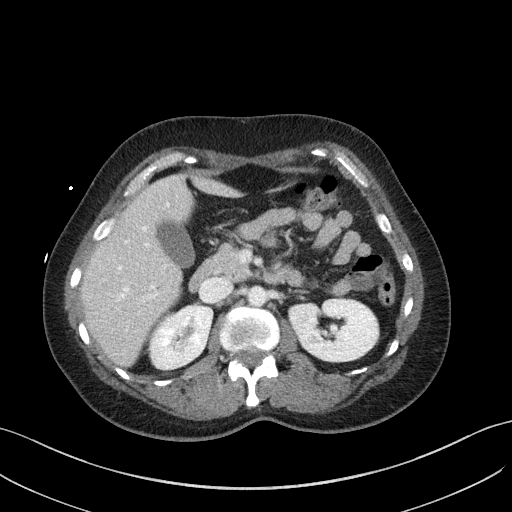
[im 68/85  soft-tissue]
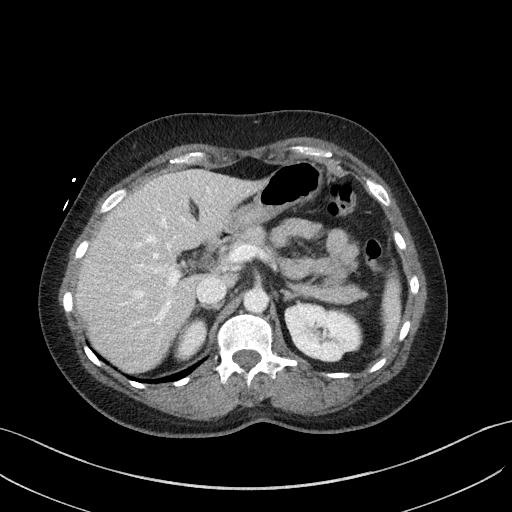
[im 74/85  soft-tissue]
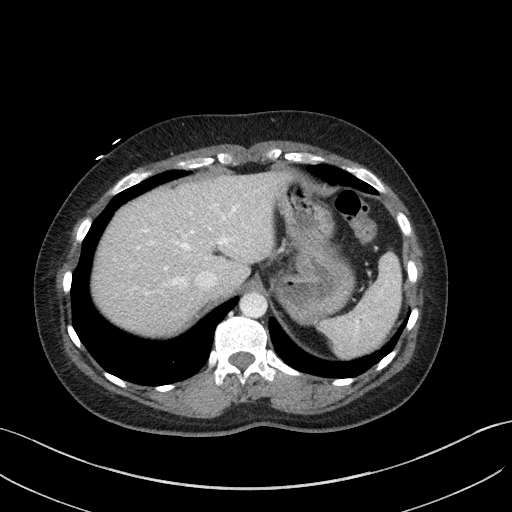
[im 81/85  soft-tissue]
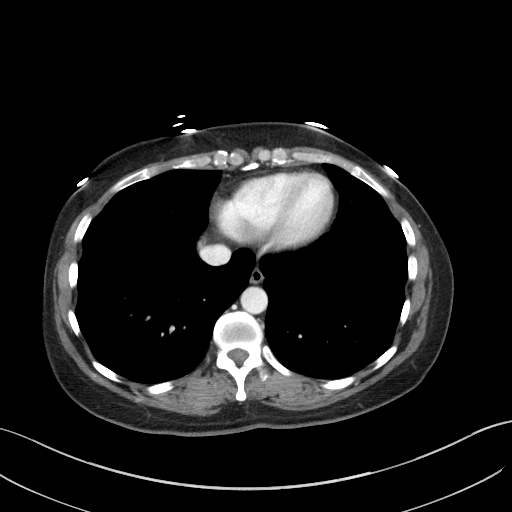

[Series 6: a/p w/ cor · coronal · 0.76mm/px · 3 of 146 slices shown]
[im 49/146  soft-tissue]
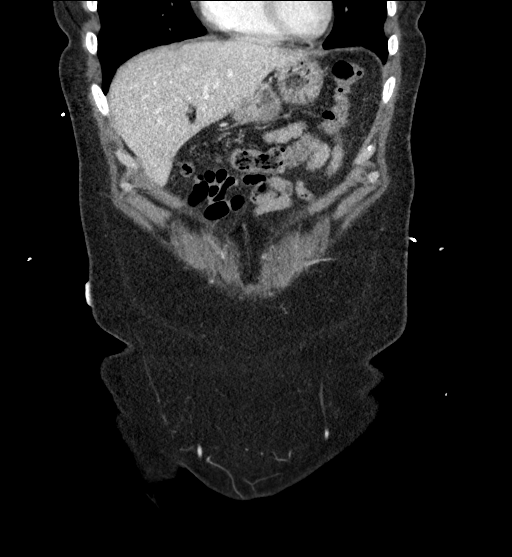
[im 65/146  soft-tissue]
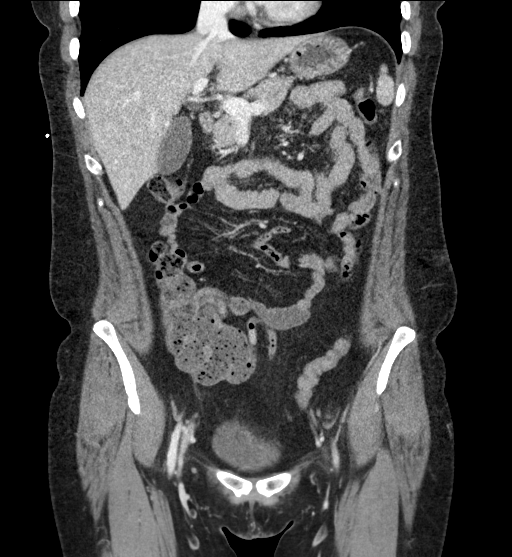
[im 81/146  soft-tissue]
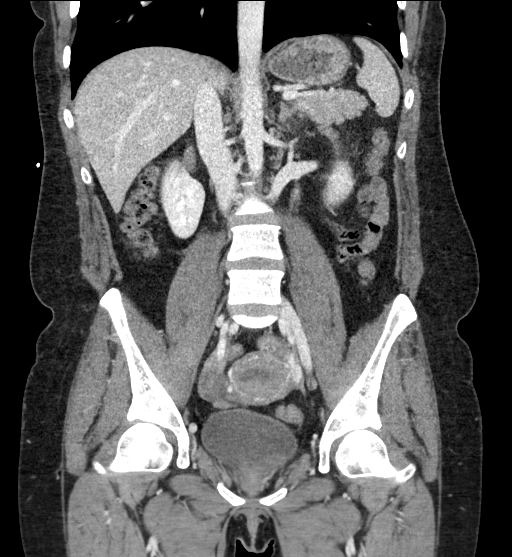

[17 of 46 positions shown; findings below may reference images not displayed]

FINDINGS: Lower chest: No acute abnormality.

Hepatobiliary: No focal liver abnormality is seen. No gallstones,
gallbladder wall thickening, or biliary dilatation.

Pancreas: Unremarkable. No pancreatic ductal dilatation or
surrounding inflammatory changes.

Spleen: Normal in size without focal abnormality.

Adrenals/Urinary Tract: Adrenal glands are unremarkable. Kidneys are
normal, without renal calculi, focal lesion, or hydronephrosis.
Bladder is unremarkable.

Stomach/Bowel: Stomach is within normal limits. Appendix appears
normal. No evidence of bowel wall thickening, distention, or
inflammatory changes.

Vascular/Lymphatic: No significant vascular findings are present. No
enlarged abdominal or pelvic lymph nodes.

Reproductive: Normal appearance of the uterus and right adnexa.
cm left ovarian cyst, possibly physiologic.

Other: No abdominal wall hernia or abnormality. No abdominopelvic
ascites.

Musculoskeletal: No acute or significant osseous findings.
IMPRESSION: 1. No evidence of acute abnormalities within the abdomen or pelvis.
[DATE] cm left ovarian cyst, possibly physiologic. Follow-up with
pelvic ultrasound in 6-8 weeks may be considered.

## 2022-09-10 IMAGING — US US OB < 14 WEEKS - US OB TV
1 series · 15 of 28 positions shown · non-contrast
Comparison: None

CLINICAL DATA: Bleeding in early pregnancy, LMP 12/21/2019

EXAM:
OBSTETRIC <14 WK US AND TRANSVAGINAL OB US
TECHNIQUE: Both transabdominal and transvaginal ultrasound examinations were
performed for complete evaluation of the gestation as well as the
maternal uterus, adnexal regions, and pelvic cul-de-sac.
Transvaginal technique was performed to assess early pregnancy.

[Series 1: us ob < 14 weeks - us ob tv · 85 acquisitions, 15 frames shown]
[im 1/85]
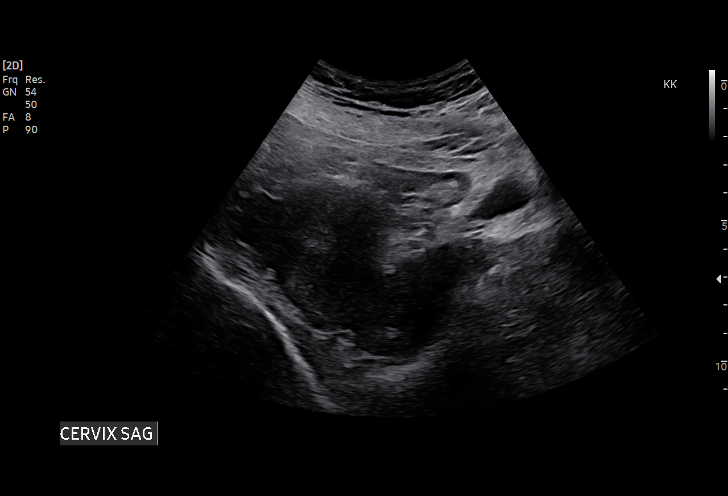
[im 7/85]
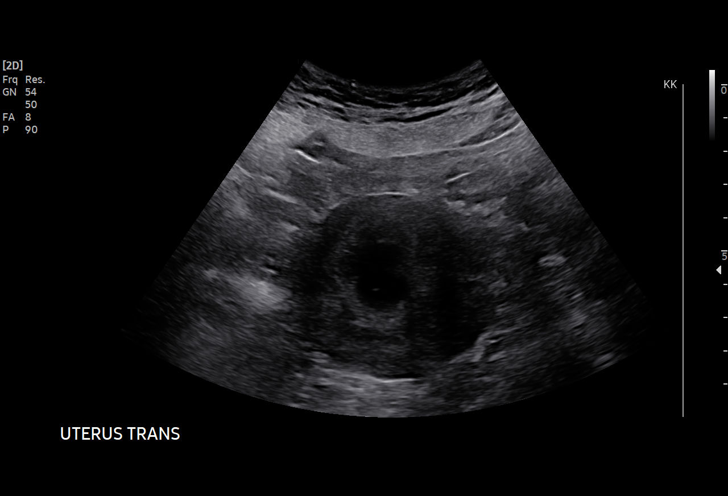
[im 13/85]
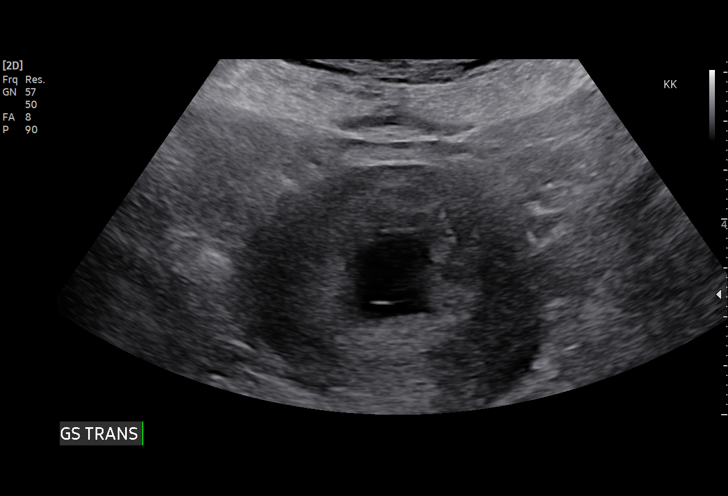
[im 19/85]
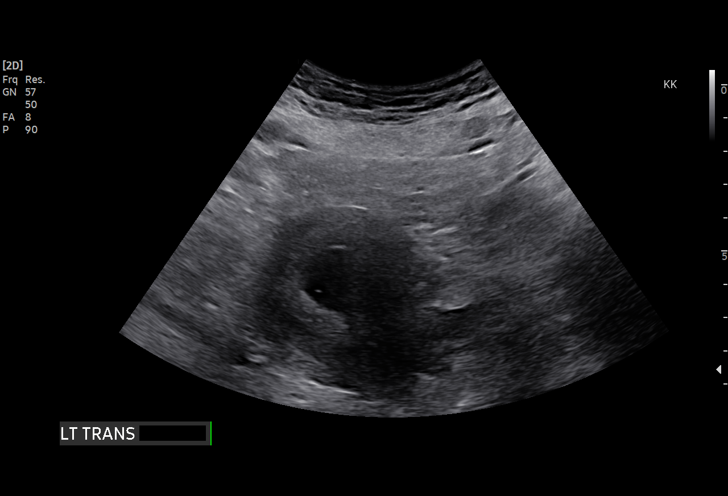
[im 25/85]
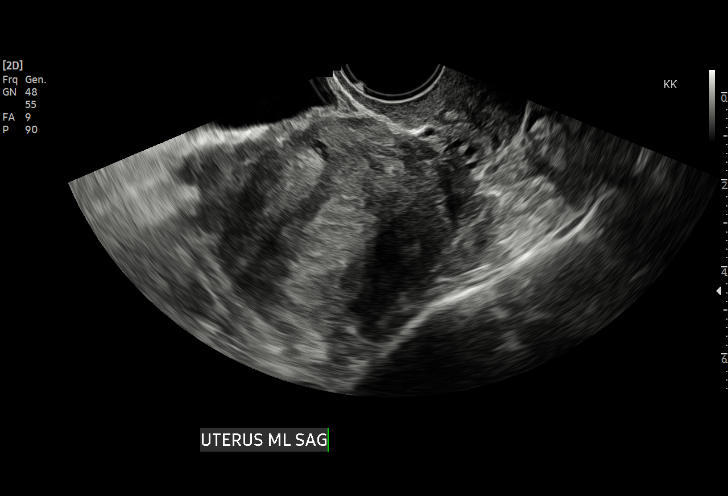
[im 32/85]
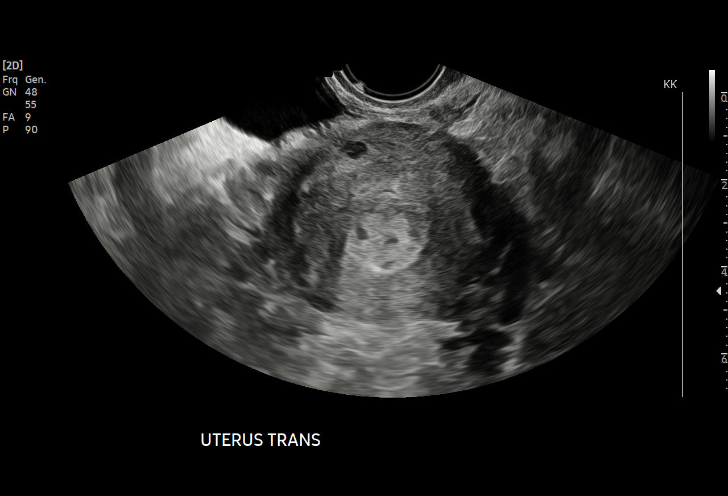
[im 38/85]
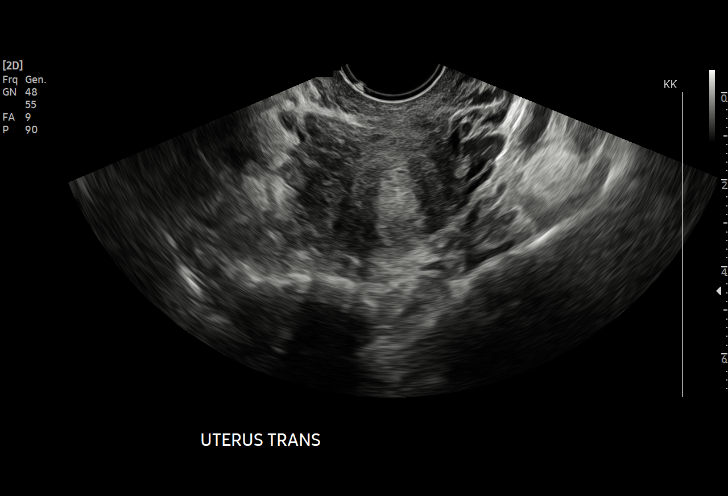
[im 44/85]
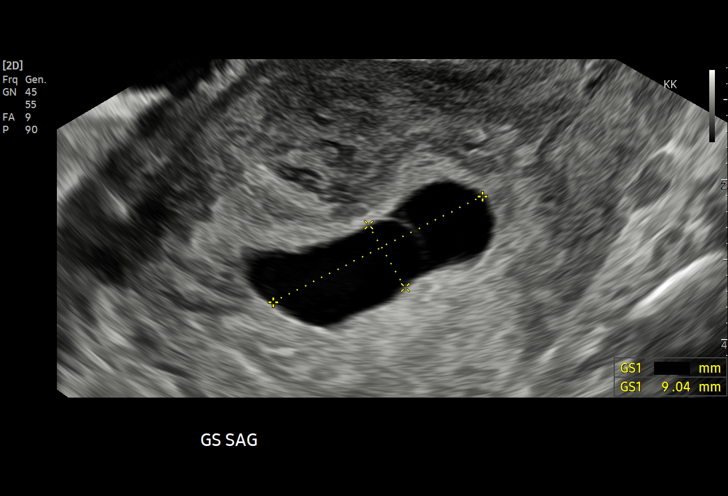
[im 47/85]
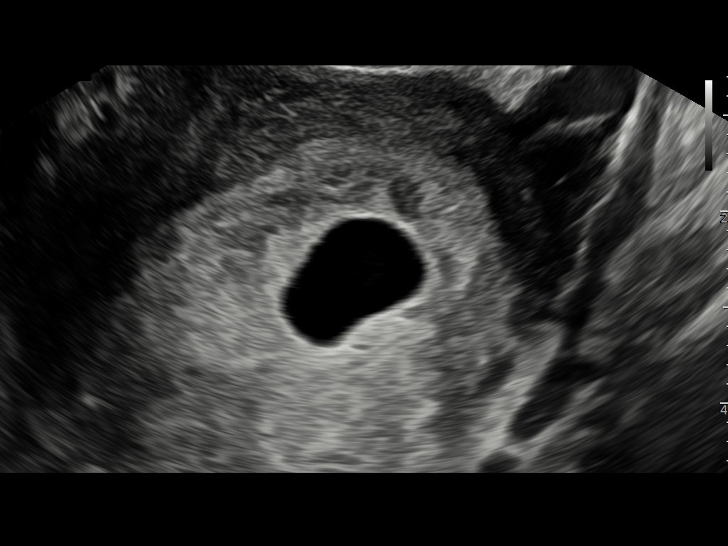
[im 53/85]
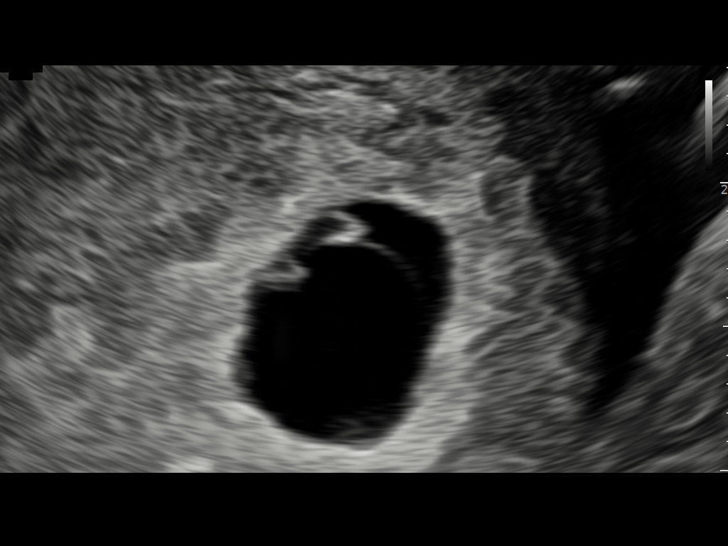
[im 60/85]
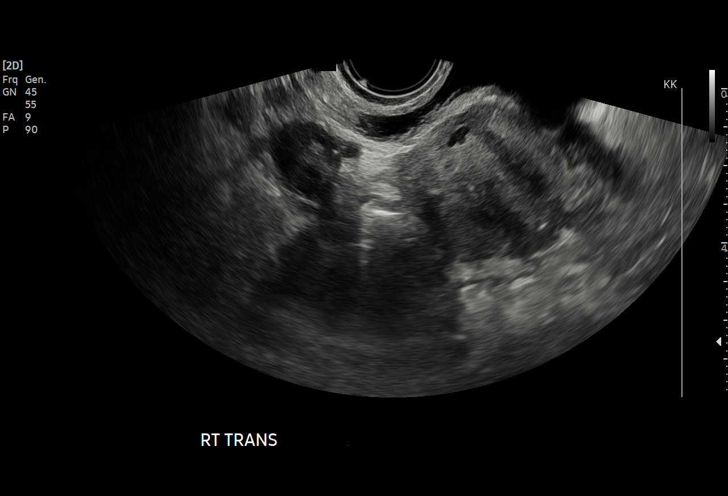
[im 66/85]
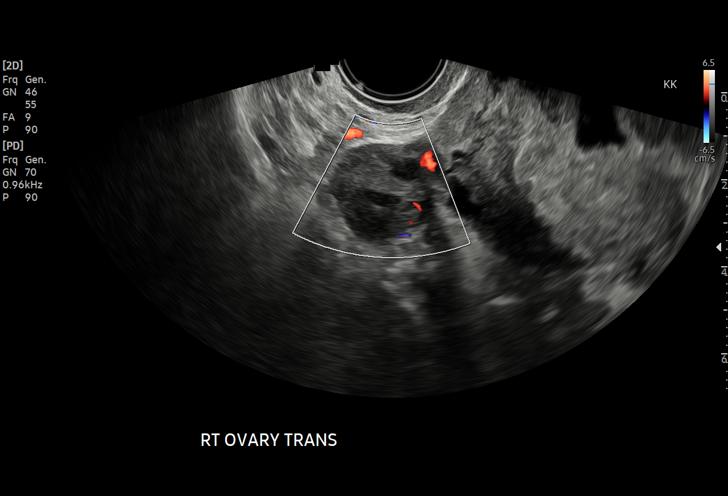
[im 72/85]
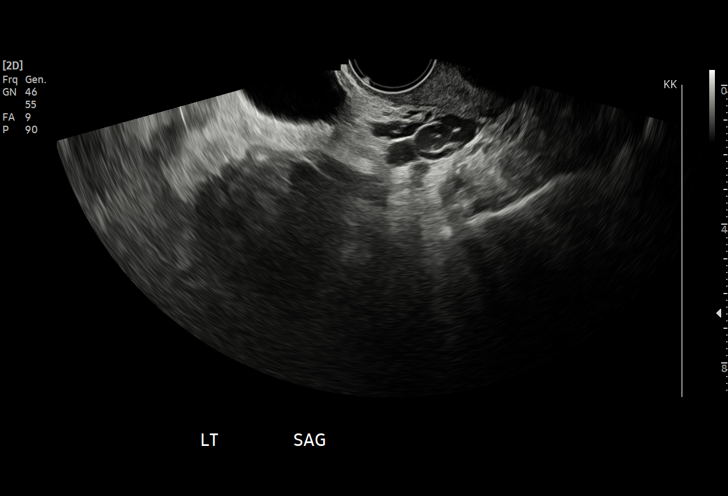
[im 78/85]
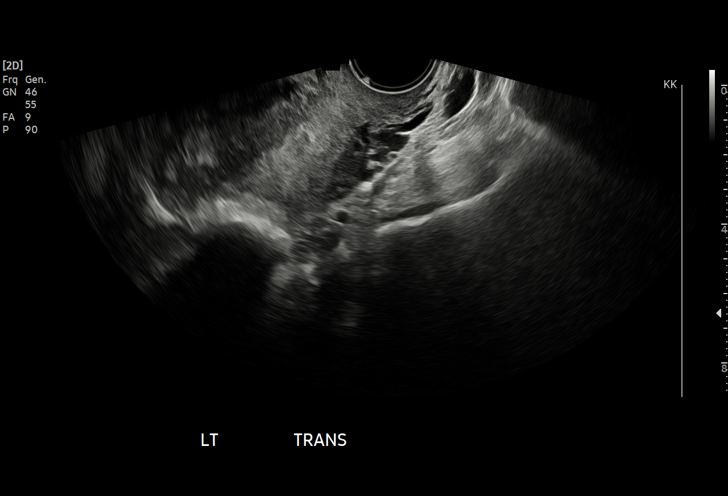
[im 85/85]
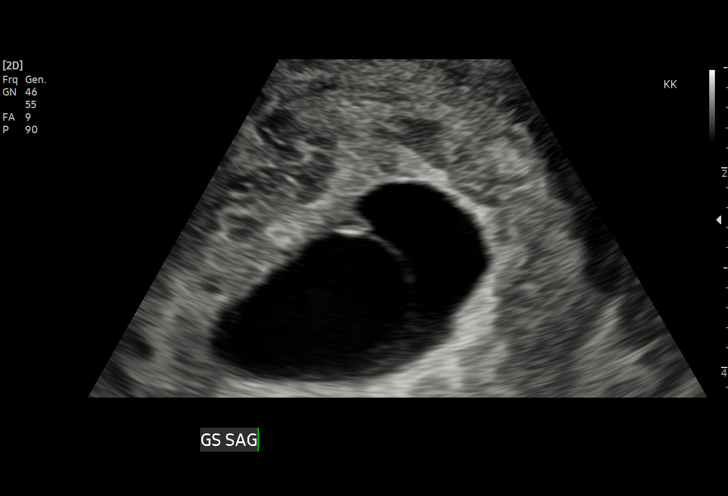

[15 of 28 positions shown; findings below may reference images not displayed]

FINDINGS: Intrauterine gestational sac: Present, single

Yolk sac:  Not definitely identified, see below

Embryo:  Questionably visualized, see below

Cardiac Activity: N/A

Heart Rate: N/A  bpm

MSD: 10.7 mm   5 w   6 d

Subchorionic hemorrhage:  None visualized.

Maternal uterus/adnexae:

Thin membrane is seen within the gestational sac, nonspecific,
cannot exclude abnormal enlarged yolk sac.

Two nodular foci are seen within the gestational sac 2.5 mm and
mm in sizes which are indeterminate.

Uterus otherwise unremarkable.

RIGHT ovary normal size and morphology, 2.1 x 2.6 x 2.2 cm.

LEFT ovary normal size and morphology, 2.8 x 1.5 x 1.3 cm.

No free pelvic fluid or adnexal masses.
IMPRESSION: Gestational sac seen within the uterus, mean sac diameter
corresponding to 5 weeks 6 days EGA.

Questionable abnormal enlarged yolk sac versus nonspecific
curvilinear membrane within gestational sac.

Two additional tiny nodular foci are identified within the
gestational sac, indeterminate, questionable tiny fetal pole versus
debris.

Consider follow-up ultrasound in 14 weeks to reassess viability.
